# Patient Record
Sex: Male | Born: 1970 | Race: White | Hispanic: No | Marital: Married | Smoking: Never smoker
Health system: Southern US, Community
[De-identification: ages and names within clinical notes are randomized; demographics above are authoritative.]

## PROBLEM LIST (undated history)

## (undated) DIAGNOSIS — K5900 Constipation, unspecified: Secondary | ICD-10-CM

## (undated) DIAGNOSIS — F1010 Alcohol abuse, uncomplicated: Secondary | ICD-10-CM

## (undated) DIAGNOSIS — F329 Major depressive disorder, single episode, unspecified: Secondary | ICD-10-CM

## (undated) DIAGNOSIS — I10 Essential (primary) hypertension: Secondary | ICD-10-CM

## (undated) DIAGNOSIS — Z22322 Carrier or suspected carrier of Methicillin resistant Staphylococcus aureus: Secondary | ICD-10-CM

## (undated) DIAGNOSIS — F419 Anxiety disorder, unspecified: Secondary | ICD-10-CM

## (undated) DIAGNOSIS — M7989 Other specified soft tissue disorders: Secondary | ICD-10-CM

## (undated) DIAGNOSIS — R12 Heartburn: Secondary | ICD-10-CM

## (undated) DIAGNOSIS — M549 Dorsalgia, unspecified: Secondary | ICD-10-CM

## (undated) DIAGNOSIS — M2550 Pain in unspecified joint: Secondary | ICD-10-CM

## (undated) DIAGNOSIS — G4730 Sleep apnea, unspecified: Secondary | ICD-10-CM

## (undated) DIAGNOSIS — K5903 Drug induced constipation: Secondary | ICD-10-CM

## (undated) DIAGNOSIS — T402X5A Adverse effect of other opioids, initial encounter: Secondary | ICD-10-CM

## (undated) DIAGNOSIS — F32A Depression, unspecified: Secondary | ICD-10-CM

## (undated) DIAGNOSIS — G709 Myoneural disorder, unspecified: Secondary | ICD-10-CM

## (undated) HISTORY — DX: Myoneural disorder, unspecified: G70.9

## (undated) HISTORY — DX: Carrier or suspected carrier of methicillin resistant Staphylococcus aureus: Z22.322

## (undated) HISTORY — DX: Depression, unspecified: F32.A

## (undated) HISTORY — DX: Major depressive disorder, single episode, unspecified: F32.9

## (undated) HISTORY — DX: Heartburn: R12

## (undated) HISTORY — DX: Other specified soft tissue disorders: M79.89

## (undated) HISTORY — DX: Constipation, unspecified: K59.00

## (undated) HISTORY — PX: SPINAL CORD STIMULATOR INSERTION: SHX5378

## (undated) HISTORY — PX: SURGERY SCROTAL / TESTICULAR: SUR1316

## (undated) HISTORY — DX: Dorsalgia, unspecified: M54.9

## (undated) HISTORY — DX: Adverse effect of other opioids, initial encounter: T40.2X5A

## (undated) HISTORY — DX: Sleep apnea, unspecified: G47.30

## (undated) HISTORY — DX: Alcohol abuse, uncomplicated: F10.10

## (undated) HISTORY — DX: Pain in unspecified joint: M25.50

## (undated) HISTORY — DX: Anxiety disorder, unspecified: F41.9

## (undated) HISTORY — DX: Essential (primary) hypertension: I10

## (undated) HISTORY — DX: Drug induced constipation: K59.03

---

## 2005-01-06 ENCOUNTER — Ambulatory Visit: Payer: Self-pay | Admitting: Pain Medicine

## 2005-02-03 ENCOUNTER — Ambulatory Visit: Payer: Self-pay | Admitting: Pain Medicine

## 2005-02-17 ENCOUNTER — Ambulatory Visit: Payer: Self-pay | Admitting: Pain Medicine

## 2005-03-03 ENCOUNTER — Ambulatory Visit: Payer: Self-pay | Admitting: Pain Medicine

## 2005-03-18 ENCOUNTER — Ambulatory Visit: Payer: Self-pay | Admitting: Physician Assistant

## 2005-03-31 ENCOUNTER — Ambulatory Visit: Payer: Self-pay | Admitting: Physician Assistant

## 2005-04-06 ENCOUNTER — Ambulatory Visit: Payer: Self-pay | Admitting: Pain Medicine

## 2005-04-08 ENCOUNTER — Ambulatory Visit: Payer: Self-pay | Admitting: Pain Medicine

## 2005-04-14 ENCOUNTER — Ambulatory Visit: Payer: Self-pay | Admitting: Pain Medicine

## 2005-05-13 ENCOUNTER — Ambulatory Visit: Payer: Self-pay | Admitting: Pain Medicine

## 2005-06-15 ENCOUNTER — Ambulatory Visit: Payer: Self-pay | Admitting: Physician Assistant

## 2005-07-09 ENCOUNTER — Ambulatory Visit: Payer: Self-pay | Admitting: Physician Assistant

## 2005-07-27 ENCOUNTER — Ambulatory Visit: Payer: Self-pay | Admitting: Pain Medicine

## 2005-08-11 ENCOUNTER — Ambulatory Visit: Payer: Self-pay | Admitting: Physician Assistant

## 2005-09-16 ENCOUNTER — Ambulatory Visit: Payer: Self-pay | Admitting: Physician Assistant

## 2005-10-29 ENCOUNTER — Ambulatory Visit: Payer: Self-pay | Admitting: Physician Assistant

## 2005-10-29 ENCOUNTER — Other Ambulatory Visit: Payer: Self-pay

## 2005-11-05 ENCOUNTER — Ambulatory Visit: Payer: Self-pay | Admitting: Pain Medicine

## 2005-11-11 ENCOUNTER — Ambulatory Visit: Payer: Self-pay | Admitting: Pain Medicine

## 2005-12-01 ENCOUNTER — Ambulatory Visit: Payer: Self-pay | Admitting: Physician Assistant

## 2005-12-17 ENCOUNTER — Ambulatory Visit: Payer: Self-pay | Admitting: Pain Medicine

## 2005-12-20 ENCOUNTER — Ambulatory Visit: Payer: Self-pay | Admitting: Pain Medicine

## 2005-12-22 ENCOUNTER — Ambulatory Visit: Payer: Self-pay | Admitting: Pain Medicine

## 2005-12-27 ENCOUNTER — Ambulatory Visit: Payer: Self-pay | Admitting: Physician Assistant

## 2006-01-25 ENCOUNTER — Ambulatory Visit: Payer: Self-pay | Admitting: Physician Assistant

## 2006-03-02 ENCOUNTER — Ambulatory Visit: Payer: Self-pay | Admitting: Physician Assistant

## 2006-03-30 ENCOUNTER — Ambulatory Visit: Payer: Self-pay | Admitting: Pain Medicine

## 2006-04-27 ENCOUNTER — Ambulatory Visit: Payer: Self-pay | Admitting: Pain Medicine

## 2006-05-18 ENCOUNTER — Ambulatory Visit: Payer: Self-pay | Admitting: Pain Medicine

## 2006-05-25 ENCOUNTER — Ambulatory Visit: Payer: Self-pay | Admitting: Physician Assistant

## 2006-05-31 ENCOUNTER — Ambulatory Visit: Payer: Self-pay | Admitting: Pain Medicine

## 2006-06-22 ENCOUNTER — Ambulatory Visit: Payer: Self-pay | Admitting: Pain Medicine

## 2006-07-21 ENCOUNTER — Ambulatory Visit: Payer: Self-pay | Admitting: Physician Assistant

## 2006-07-26 ENCOUNTER — Ambulatory Visit: Payer: Self-pay | Admitting: Pain Medicine

## 2006-08-17 ENCOUNTER — Ambulatory Visit: Payer: Self-pay | Admitting: Pain Medicine

## 2006-09-05 ENCOUNTER — Ambulatory Visit: Payer: Self-pay | Admitting: Pain Medicine

## 2006-09-16 ENCOUNTER — Ambulatory Visit: Payer: Self-pay | Admitting: Physician Assistant

## 2006-09-20 ENCOUNTER — Ambulatory Visit: Payer: Self-pay | Admitting: Pain Medicine

## 2006-11-02 ENCOUNTER — Ambulatory Visit: Payer: Self-pay | Admitting: Physician Assistant

## 2006-12-02 ENCOUNTER — Ambulatory Visit: Payer: Self-pay | Admitting: Physician Assistant

## 2006-12-29 ENCOUNTER — Ambulatory Visit: Payer: Self-pay | Admitting: Physician Assistant

## 2007-05-08 ENCOUNTER — Ambulatory Visit: Payer: Self-pay | Admitting: Pain Medicine

## 2007-05-23 ENCOUNTER — Ambulatory Visit: Payer: Self-pay | Admitting: Pain Medicine

## 2007-06-06 ENCOUNTER — Ambulatory Visit: Payer: Self-pay | Admitting: Physician Assistant

## 2011-10-27 ENCOUNTER — Other Ambulatory Visit: Payer: Self-pay | Admitting: Pain Medicine

## 2011-10-27 ENCOUNTER — Ambulatory Visit: Payer: Self-pay | Admitting: Pain Medicine

## 2011-10-28 ENCOUNTER — Ambulatory Visit: Payer: Self-pay | Admitting: Pain Medicine

## 2011-11-26 ENCOUNTER — Ambulatory Visit: Payer: Self-pay | Admitting: Pain Medicine

## 2011-11-29 ENCOUNTER — Ambulatory Visit: Payer: Self-pay | Admitting: Pain Medicine

## 2014-07-17 ENCOUNTER — Ambulatory Visit: Payer: Self-pay | Admitting: Pain Medicine

## 2014-08-21 ENCOUNTER — Ambulatory Visit: Payer: Self-pay | Admitting: Pain Medicine

## 2014-08-21 DIAGNOSIS — I10 Essential (primary) hypertension: Secondary | ICD-10-CM | POA: Diagnosis not present

## 2014-08-21 LAB — CBC WITH DIFFERENTIAL/PLATELET
Basophil #: 0 10*3/uL (ref 0.0–0.1)
Basophil %: 0.5 %
EOS PCT: 2.5 %
Eosinophil #: 0.2 10*3/uL (ref 0.0–0.7)
HCT: 37.8 % — ABNORMAL LOW (ref 40.0–52.0)
HGB: 12.4 g/dL — ABNORMAL LOW (ref 13.0–18.0)
Lymphocyte #: 2.2 10*3/uL (ref 1.0–3.6)
Lymphocyte %: 31.4 %
MCH: 28.1 pg (ref 26.0–34.0)
MCHC: 32.9 g/dL (ref 32.0–36.0)
MCV: 86 fL (ref 80–100)
Monocyte #: 0.5 x10 3/mm (ref 0.2–1.0)
Monocyte %: 6.8 %
NEUTROS ABS: 4.2 10*3/uL (ref 1.4–6.5)
NEUTROS PCT: 58.8 %
PLATELETS: 316 10*3/uL (ref 150–440)
RBC: 4.42 10*6/uL (ref 4.40–5.90)
RDW: 14.9 % — AB (ref 11.5–14.5)
WBC: 7.2 10*3/uL (ref 3.8–10.6)

## 2014-08-21 LAB — POTASSIUM: Potassium: 4.1 mmol/L (ref 3.5–5.1)

## 2014-08-27 ENCOUNTER — Ambulatory Visit: Admit: 2014-08-27 | Disposition: A | Discharge status: Home / Self Care | Payer: Self-pay | Admitting: Pain Medicine

## 2014-08-27 ENCOUNTER — Ambulatory Visit: Payer: Self-pay | Admitting: Pain Medicine

## 2014-09-05 ENCOUNTER — Ambulatory Visit: Payer: Self-pay | Admitting: Pain Medicine

## 2015-02-08 NOTE — Op Note (Signed)
PATIENT NAME:  Ian Morse, Ian Morse MR#:  [id number] DATE OF BIRTH:  01-[name]-1971  DATE OF PROCEDURE:  08/27/2014  REFERRING PHYSICIAN: Jolayne Panther, MD  CONSULTING PAIN PHYSICIAN:  Milinda Pointer, MD    Note:  This is the case of a 44 year old morbidly obese white male patient who comes in to same-day surgery today for revision and replacement of his spinal cord stimulator generator/battery. 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  Procedure(s):  1. Neurostimulator Generator Replacement (Battery change). 2. Fluoroscopic Guidance 3. Intraoperative Analysis and Programming. 4. Postoperative Analysis and Programming. 5. Moderate Conscious Sedation by the [facility] Anesthesia Team.  Surgeon: Kathlen Brunswick. Dossie Arbour, M.D. Side of implant: Right side Diagnostic Indications:   1.  End of battery of life. 2.  Left upper extremity complex regional pain syndrome. Position: Prone.  Prepping solution: DuraPrep Area prepped: The right thoracic flank area prepped with a broad-spectrum topical antiseptic microbicide.  Infection Control:  Standard Universal Precautions taken (Respiratory Hygiene/Cough Etiquette; Mouth, nose, eye protection; Hand Hygiene; Personal protective equipment (PPE); safe injection practices; and use of masks and disposable sterile surgical gloves) as recommended by the Department of [place] for Disease Control and Prevention (CDC).  Safety Measures:  Allergies were reviewed. Appropriate site, procedure, and patient were confirmed by following the Joint Commission's Universal Protocol (UP.01.01.01). The patient was asked to confirm marked site and procedure, before commencing. The patient was asked about blood thinners, or active infections, both of which were denied. No attempt was made at seeking any  paresthesias. Aspiration looking for blood return was conducted prior to injecting. At no point did we inject any substances, as a needle was being advanced.  Pre-procedure Assessment:  A medical history and physical exam were obtained. Relevant documentation was reviewed and verified. Prior to the procedure, the patient was provided with an Audio CD, as well as written information on the procedure, including side-effects, and possible complications. Under the influence of no sedatives, a verbal, as well as a written informed consent were obtained, after having provided information on the risks and possible complications. To fulfill our ethical and legal obligations, as recommended by the American Medical Association's Code of Ethics, we have provided information to the patient about our clinical impression; the nature and purpose of an available treatment or procedure; the risks and benefits of an available treatment or procedure; alternatives; the risk and benefits of the alternative treatment or procedure; and the risks and benefits of not receiving or undergoing a treatment or procedure. The patient was provided information about the risks and possible complications associated with the procedure. These include, but not limited to, failure to achieve desired goals, infection, bleeding, organ or nerve damage, allergic reactions, paralysis, and death. In addition, the patient was informed that Medicine is not an exact science; therefore, there is also the possibility of unforeseen risks and possible complications that may result in a catastrophic outcome. The patient indicated having understood very clearly.  We have given the patient no guarantees and we have made no promises. Ample time was given to the patient to ask questions, all of which were answered, to the patient's satisfaction, before proceeding. The patient understands that by signing our informed consent form, they understand and accept the risks and  the fact that it is impossible to predict all possible complications. Baseline vital signs were taken and  the medical assessment was completed. Verification of the correct person, correct site (including marking of site), and correct procedure were performed and confirmed by the patient. Baseline vital signs were taken and the initial assessment was completed. Verification of the correct person, correct site (including marking of site), and correct procedure were performed and confirmed by the patient, in the form of a "Time Out".  Monitoring: The patient was monitored in the usual manner, using NIBPM, ECG, and pulse oximetry.  IV Access:  An IV access was obtained and secured.  Analgesia:  Moderate (Conscious) Intravenous sedation: Consent was obtained before administering any sedation. Availability of a responsible, adult driver, and NPO status confirmed. Meaningful verbal contact was maintained, with the patient at all times during the procedure. ASA Sedation Guidelines followed. For specifics on pharmacological type and quantity of sedation, please see nursing chart.  Prophylactic Antibiotics: Cefazolin (1st generation cephalosporin) 1 gm IVPB.  Local Anesthesia: Lidocaine 1%. The skin over the procedure site were infiltrated using a 3 ml Luer-Lok syringe with a 0.5 inch, 25-G needle. Deeper tissues were infiltrated using a 3.0 inch, 22-G spinal needle, under fluoroscopic guidance.  Fluoroscopy: The patient was taken to the operative suite, where the patient was placed in position for the procedure, over the fluoroscopy compatible table. Fluoroscopy was manipulated, using "Tunnel Vision Technique", to obtain the best possible view of the target area, on the affected side. Fluoroscopy time: Please see the patient's chart for details.  Description of the procedure: The procedure site was prepped using a broad-spectrum topical antiseptic. The area was then draped in the usual and standard manner.  "Time-out" was performed as per JC Universal Protocol (UP.01.01.01).   An incision was made and the old generator was located and identified. Care was taken not to damage any of the cables. The old generator was taken out and disconnected from the extension cables. At this point, the new generator was connected to the extensions and tested. An impedance check was conducted after connecting all sections of the system. After having confirmed proper working status, the new generator was placed back into the pocket. Once hemostasis was confirmed, both wounds were closed with Vicryl 2-0 after cleaning them with a solution containing 50:50 hydrogen peroxide and Betadine. Surgical staples were used to close the skin. The wounds were covered with sterile transparent bio-occlusive dressings, to easily assess any evidence of infection in the future.  The patient tolerated the entire procedure well. A repeat set of vitals were taken after the procedure and the patient was kept under observation until discharge criteria was met. The patient was provided with discharge instructions, including a section on how to identify potential problems. Should any problems arise concerning this procedure, the patient was given instructions to immediately contact us, without hesitation. The neurostimulator representative and I, both provided the patient with our Business cards containing our contact telephone numbers, and instructed the patient to contact either one of [place], at any time, should there be any problems or questions. In any case, we plan to contact the patient by telephone for a follow-up status report regarding this interventional procedure.  EBL: 10 ml  Complications: No heme; no paresthesias.  Disposition: Return to clinics in 10-11 days for removal of staples and postoperative evaluation.  Return to clinic on 09/05/2014 at 11:15 a.m.  Additional Comments/Plan: None.  Equipment used:   1.  Medtronic Restore Sure  scan sensor generator model [id number], serial number [id number].  2.  Medtronic patient programmer model [phone],  serial #[id number]. 3.  Medtronic charging system model [id number]. serial number [id number].    Disclaimer: Medicine is not an Chief Strategy Officer. The only guarantee in medicine is that nothing is guaranteed. It is important to note that the decision to proceed with this intervention was based on the information collected from the patient. The Data and conclusions were drawn from the patient's questionnaire, the interview, and the physical examination. Because the information was provided in large part by the patient, it cannot be guaranteed that it has not been purposely or unconsciously manipulated. Every effort has been made to obtain as much relevant data as possible for this evaluation. It is important to note that the conclusions that lead to this procedure are derived in large part from the available data. Always take into account that the treatment will also be dependent on availability of resources and existing treatment guidelines, considered by other Pain Management Practitioners as being common knowledge and practice, at this time. For Medico-Legal purposes, it is also important to point out that variations in procedural techniques and pharmacological choices are the acceptable norm. The indications, contraindications, technique, and results of the above procedure should only be interpreted and judged by a Board-Certified Interventional Pain Specialist with extensive familiarity and expertise in the same exact procedure and technique, doing otherwise would be inappropriate and unethical.    ____________________________ Kathlen Brunswick. Dossie Arbour, MD fan:DT D: 08/28/2014 07:04:29 ET T: 08/28/2014 10:19:32 ET JOB#: [id number]  cc: Emmarie Sannes A. Dossie Arbour, MD, <Dictator> Gaspar Cola MD ELECTRONICALLY SIGNED 08/29/2014 18:04

## 2016-09-15 DIAGNOSIS — F419 Anxiety disorder, unspecified: Secondary | ICD-10-CM | POA: Insufficient documentation

## 2016-09-15 DIAGNOSIS — N528 Other male erectile dysfunction: Secondary | ICD-10-CM | POA: Insufficient documentation

## 2016-09-15 DIAGNOSIS — I10 Essential (primary) hypertension: Secondary | ICD-10-CM | POA: Insufficient documentation

## 2017-01-17 ENCOUNTER — Telehealth: Payer: Self-pay | Admitting: *Deleted

## 2017-04-04 NOTE — Progress Notes (Signed)
Patient's Name: Ian Morse  MRN: [id number]  Referring Provider: Marinell Blight, *  DOB: 1971/08/12  PCP: Marinell Blight, PA-C  DOS: 04/05/2017  Note by: Sydnee Levans. Laban Emperor, MD  Service setting: Ambulatory outpatient  Specialty: Interventional Pain Management  Location: [facility] (AMB) Pain Management Facility    Patient type: New Patient   Primary Reason(s) for Visit: Initial Patient Evaluation CC: Neck Pain (shoulders- left is worst); Hand Pain (left pointer RSD); and Back Pain (lower)  HPI  Mr. Ian Morse is a 46 y.o. year old, male patient, who comes today for an initial evaluation. 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 His primarily concern today is the Neck Pain (shoulders- left is worst); Hand Pain (left pointer RSD); and Back Pain (lower)  Pain Assessment: Self-Reported Pain Score: 6 /10 Clinically the patient looks like a 2/10 Reported level is inconsistent with clinical observations. Information on the proper use of the pain scale provided to the patient today Pain Type: Chronic pain Pain Location: Finger (Comment which one) Pain Orientation: Left Pain Descriptors / Indicators: Aching, Nagging, Numbness, Shooting, Burning, Constant Pain Frequency: Constant  Onset and  Duration: Gradual, Started with accident and Date of onset: 10/21/2013 Cause of pain: Work related accident or event Severity: Getting worse, NAS-11 at its worse: 5/10, NAS-11 at its best: 5/10 and NAS-11 now: 5/10 Timing: Not influenced by the time of the day and During activity or exercise Aggravating Factors: Bending, Climbing, Kneeling, Lifiting, Motion, Prolonged standing, Squatting, Stooping , Twisting, Walking, Walking uphill, Walking downhill and Working Alleviating Factors: Cold packs, Hot packs, Lying down, Medications, Nerve blocks, Resting, Sitting and Sleeping Associated Problems: Color changes, Constipation, Day-time cramps, Night-time cramps, Depression, Dizziness, Erectile dysfunction, Fatigue, Inability to concentrate, Numbness, Personality changes, Sadness, Spasms, Sweating, Swelling, Temperature changes, Tingling, Weakness, Pain that wakes patient up and Pain that does not allow patient to sleep Quality of Pain: Aching, Agonizing, Annoying, Burning, Constant, Cramping, Cruel, Disabling, Distressing, Dreadful, Dull, Exhausting, Fearful, Feeling of constriction, Horrible, Hot, Itching, Nagging, Pulsating, Punishing, Sharp, Shooting, Stabbing, Superficial, Tender, Throbbing, Tingling, Tiring, Uncomfortable and Work related Previous Examinations or Tests: CT scan, MRI scan, Nerve block, X-rays, Nerve conduction test, Neurological evaluation, Orthoperdic evaluation, Chiropractic evaluation and Psychiatric evaluation Previous Treatments: Chiropractic manipulations, Hypnotherapy, Narcotic medications, Physical Therapy, Spinal cord stimulator and Steroid treatments by mouth  The patient comes into the clinics today for the first time for a chronic pain management evaluation. According to the patient and primary area of pain is that of the left upper extremity, where he developed a complex regional pain syndrome. This patient used to be a patient of mine and he moved to another practice and has  now come back to [place] once again. Years ago I implanted a cervical spinal cord stimulator for his left upper extremity complex regional pain syndrome which continues to work at this time. The original injury was a crushed injury to the index finger triggering this  CRPS. The patient denies any prior surgeries in the left upper extremity but does admit to some nerve blocks done by myself, a Bier blocks done by Dr. Yolanda Bonine and in [place], and some stellate ganglion blocks done by Dr. Lenore Cordia in [place]. He indicates that the Bier blocks never helped and the relief from the stellate ganglion blocks were very limited. He also admits to having had physical therapy at [facility] around 2005. He indicates that this consisted of 2 visits per week for proximally 6 weeks. He denies any recent x-rays or MRIs but he indicates having had some nerve conduction tests, which he thinks he had in [facility] or the [facility] in [place]. The next area of pain is described to be the posterior aspect of the neck with the left side being worse than the right. He refers having pain between the shoulder blades. He denies any prior neck surgery, nerve blocks, (except for the stellate ganglion blocks), joint injections, physical therapy, or recent x-rays for the neck area. The third area of pain is described to be the testicles were he indicates having bilateral pain with the left being worst on the right. He indicates that this was secondary to an injury he received with a tractor, many years ago. He indicates having had some surgery but denies any nerve blocks, joint injections, physical therapy, or any recent x-rays or MRIs. The next area of pain is described to be that of the lower back with the pain is located primarily in the center of the back but spreading to both sides with the left being worst on the right. He indicates that the pain on the left side refers to the buttocks area and through  the back of the left lower extremity to the level of the knee. He indicates having had some nerve blocks done by me, several years ago, but he denies any back surgery, joint injections, physical therapy, or recent x-rays. The patient continues to use his Cervical spinal cord stimulator which has a battery implanted on his right flank. He describes that we changed the battery proximally 2-3 years ago.  According to the patient, he describes currently controlling his pain with the spinal cord stimulator as well as high-dose opioids. 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 He describes that at that point he decided not to go back to the methadone. Over the years, unfortunately his opioid consumption has increased to the point where he was using OxyContin 40 mg by mouth twice a day + OxyContin IR 15 mg by mouth 3 times a day. Due to insurance reasons, he was recently switched to MS Contin 30 mg by mouth twice a day + oxycodone 15 mg by mouth 3 times a day. He describes that this does not work as well as the OxyContin. One reason for this may be the fact that while he was on the OxyContin he had a 187.5 MME/Day, while now he is on 127.5 MME/Day.  Note: Review of the actual PMP shows that the patient is actually taking morphine ER 30 mg 1 tablet by mouth twice a day (60 mg/day of morphine) + oxycodone IR 15 mg 1 tablet by mouth every 6 hours when necessary for breakthrough pain (60 mg/day of oxycodone). This represents a 150 MME/Day.  Today I took the time to provide the patient with information regarding my pain practice. The patient was informed that my practice is divided  into two sections: an interventional pain management section, as well as a completely separate and distinct medication management section. I explained that I have procedure days for my interventional therapies, and evaluation days for follow-ups and medication management. Because of the  amount of documentation required during both, they are kept separated. This means that there is the possibility that he may be scheduled for a procedure on one day, and medication management the next. I have also informed him that because of staffing and facility limitations, I no longer take patients for medication management only. To illustrate the reasons for this, I gave the patient the example of surgeons, and how inappropriate it would be to refer a patient to his/her care, just to write for the post-surgical antibiotics on a surgery done by a different surgeon.   Because interventional pain management is my board-certified specialty, the patient was informed that joining my practice means that they are open to any and all interventional therapies. I made it clear that this does not mean that they will be forced to have any procedures done. What this means is that I believe interventional therapies to be essential part of the diagnosis and proper management of chronic pain conditions. Therefore, patients not interested in these interventional alternatives will be better served under the care of a different practitioner.  The patient was also made aware of my Comprehensive Pain Management Safety Guidelines where by joining my practice, they limit all of their nerve blocks and joint injections to those done by our practice, for as long as we are retained to manage their care.   Historic Controlled Substance Pharmacotherapy Review  PMP and historical list of controlled substances: Morphine ER 30 mg; oxycodone IR 15 mg; OxyContin 40 mg; alprazolam 1 mg; Hydromet syrup; Fioricet with codeine; Opana ER 20 mg; Avinza 60 mg; morphine IR 15 mg Highest opioid analgesic regimen found: OxyContin 40 mg 1 tablet by mouth twice a day (80 mg/day of oxycodone) + oxycodone IR 15 mg 1 tablet by mouth every 6 hours (60 mg/day of oxycodone) (Total daily oxycodone: 140 mg/day) (210 MME/Day) last prescribed on  12/14/2016) Most recent opioid analgesic:  morphine ER 30 mg 1 tablet by mouth twice a day (60 mg/day of morphine) + oxycodone IR 15 mg 1 tablet by mouth every 6 hours (60 mg/day of oxycodone) (150 MME/Day) (last written on 03/08/2017) Current opioid analgesics: morphine ER 30 mg 1 tablet by mouth twice a day (60 mg/day of morphine) + oxycodone IR 15 mg 1 tablet by mouth every 6 hours (60 mg/day of oxycodone) (150 MME/Day) Highest recorded MME/day:  210 mg/day MME/day: 150 mg/day Medications: The patient did not bring the medication(s) to the appointment, as requested in our "New Patient Package" Pharmacodynamics: Desired effects: Analgesia: The patient reports <50% benefit. Reported improvement in function: The patient reports medication allows him to accomplish basic ADLs. Clinically meaningful improvement in function (CMIF): Sustained CMIF goals met Perceived effectiveness: Described as relatively effective, allowing for increase in activities of daily living (ADL) Undesirable effects: Side-effects or Adverse reactions: None reported Historical Monitoring: The patient  reports that he does not use drugs. List of all UDS Test(s): No results found for: MDMA, COCAINSCRNUR, PCPSCRNUR, PCPQUANT, CANNABQUANT, THCU, ETH List of all Serum Drug Screening Test(s):  No results found for: AMPHSCRSER, BARBSCRSER, BENZOSCRSER, COCAINSCRSER, PCPSCRSER, PCPQUANT, THCSCRSER, CANNABQUANT, OPIATESCRSER, OXYSCRSER, PROPOXSCRSER Historical Background Evaluation: [place] PDMP: Six (6) year initial data search conducted. Regular, uninterrupted pattern of monthly opioid refills detected. The patient  seems to be using opioids on a regular basis since 04/11/2011. The beginning some the PMP seem to have been around that time and clearly the patient has been on opioids for longer than that since Avinza 60 mg daily + morphine IR 15 mg by mouth 3 times a day (105 MME/Day). Furthermore, contrary to the patient's description of  the events, at no point on the PMP since 04/11/2011 has this patient received any prescriptions from me. In addition, there is no documented prior history of him having used methadone meaning that this predates the beginning some the PMP system. In addition, there is absolutely no evidence that this patient has taking a "Drug Holiday" since 04/11/2011. [place] Department of public safety, offender search: Engineer, mining Information) Non-contributory Risk Assessment Profile: Aberrant behavior: continued use despite claims of ineffective analgesia, inability to consider abstinence, claims that "nothing else works" and request for speciifc drugs or requesting "Brand Name" durgs Risk factors for fatal opioid overdose: Concomitant use of Benzodiazepines, Male gender, Age 54-55 years old, Caucasian and High daily dosage Fatal overdose hazard ratio (HR): 2.04 for doses equal to, or higher than 100 MME/day Non-fatal overdose hazard ratio (HR): 2.88 for doses equal to, or higher than 200 MME/day Risk of opioid abuse or dependence: 0.7-3.0% with doses ? 36 MME/day and 6.1-26% with doses ? 120 MME/day. Substance use disorder (SUD) risk level: Moderate Opioid risk tool (ORT) (Total Score): 6  ORT Scoring interpretation table:  Score <3 = Low Risk for SUD  Score between 4-7 = Moderate Risk for SUD  Score >8 = High Risk for Opioid Abuse   PHQ-2 Depression Scale:  Total score: 0  PHQ-2 Scoring interpretation table: (Score and probability of major depressive disorder)  Score 0 = No depression  Score 1 = 15.4% Probability  Score 2 = 21.1% Probability  Score 3 = 38.4% Probability  Score 4 = 45.5% Probability  Score 5 = 56.4% Probability  Score 6 = 78.6% Probability   PHQ-9 Depression Scale:  Total score: 0  PHQ-9 Scoring interpretation table:  Score 0-4 = No depression  Score 5-9 = Mild depression  Score 10-14 = Moderate depression  Score 15-19 = Moderately severe depression  Score 20-27 = Severe depression (2.4  times higher risk of SUD and 2.89 times higher risk of overuse)   Pharmacologic Plan: Pending ordered tests and/or consults  Meds  The patient has a current medication list which includes the following prescription(s): albuterol, butalbital-acetaminophen-caffeine, calcium-vitamin d, duloxetine, fluoxetine, fluticasone, gabapentin, ginkgo biloba, klor-con m10, krill oil, lactulose, lisinopril-hydrochlorothiazide, morphine, multiple vitamins-minerals, oxycodone, magnesium (amino acid chelate), testosterone, trazodone, turmeric, and vitamin b-12.  No current outpatient prescriptions on file prior to visit.   No current facility-administered medications on file prior to visit.    Imaging Review  Cervical Imaging: Cervical MR wo contrast:  Results for orders placed in visit on 04/06/05  MR C Spine Ltd W/O Cm   Narrative * PRIOR REPORT IMPORTED FROM AN EXTERNAL SYSTEM *   PRIOR REPORT IMPORTED FROM THE [facility] WORKFLOW SYSTEM   REASON FOR EXAM:  Chronic neck and shoulder pain as well as upper  extremities  COMMENTS:   PROCEDURE:     MR  - MR CERVICAL SPINE WO CONT  - Apr 06 2005 10:27AM   RESULT:     Multisequence/multiplanar imaging of the cervical spine was  performed without contrast.  No evidence of abnormal signal is noted  within the brachium pontis, brainstem, or proximal spinal cord.  The  cervical  spine aligns anatomically without evidence of abnormal osseous signal.  There is disk desiccation and mild intervertebral disk space narrowing throughout  the cervical spine.  The prevertebral soft tissues and posterior elements are  unremarkable.  The axial images show no evidence of neural foraminal  narrowing or central canal stenosis at C2-3 or C3-4.  At C4-5 there is a  small posterior spur which is impinging upon the anterior aspect of the  thecal sac and causing some flattening but there is no neural foraminal  narrowing or central canal stenosis.  At C5-6 and C6-7 there are  prominent  posterior uncovertebral spurs along with central subligamentous disk  bulging.  These are combining to flatten the anterior aspect of the thecal  sac.  I do not see obvious neural foraminal narrowing at these levels but  there is borderline central canal stenosis at C5-6 due to these arthritic  changes with the AP diameter of the central canal measuring only 1 cm.  There is a normal relationship between C7 and T1.   IMPRESSION:   There are posterior uncovertebral spurs at C5-6 and C6-7.  These in  combination with central subligamentous disk bulging are flattening the  anterior aspect of the thecal sac at both of these levels with borderline  central canal stenosis noted at C5-6 as the AP diameter measures only 1  cm.  No obvious neural foraminal narrowing is identified.   No definite HNP is noted.   No evidence of abnormal signal is noted within the cervical cord.   The prevertebral soft tissues and posterior elements are unremarkable.       Thoracic Imaging: Thoracic MR wo contrast:  Results for orders placed in visit on 04/08/05  MR T Spine Ltd W/O Cm   Narrative * PRIOR REPORT IMPORTED FROM AN EXTERNAL SYSTEM *   PRIOR REPORT IMPORTED FROM THE [facility] WORKFLOW SYSTEM   REASON FOR EXAM:  Neck and Shoulder Pain Chronic Upper Extr  COMMENTS:  Cell [phone] or Home [phone]   PROCEDURE:     MR  - MR THORACIC SPINE WO  - Apr 08 2005 10:26AM   RESULT:     Multiplanar and multisequence images of the thoracic spine are  obtained.  There is no evidence of disc protrusion or spinal stenosis.   The  neural foramen are patent.  No bony lesions are identified.  No paraspinal  lesions are identified.   IMPRESSION:     No significant abnormalities are identified.       Thoracic CT w/wo contrast:  Results for orders placed in visit on 07/27/05  CT T Spine Ltd Wo Or W/ Cm   Narrative * PRIOR REPORT IMPORTED FROM AN EXTERNAL SYSTEM *   PRIOR REPORT IMPORTED FROM  THE [facility] WORKFLOW SYSTEM   REASON FOR EXAM:  PTSB  COMMENTS:   PROCEDURE:     CT  - CT THORACIC SPINE WO  - Jul 27 2005  8:57AM   RESULT:     A needle was placed in the RIGHT paraspinal region at  approximately T3.  Contrast was injected adjacent to the vertebral body on  the RIGHT.  The procedure was performed by Dr. Laban Emperor.   IMPRESSION:   1)See above.   Thank you for this opportunity to contribute to the care of your patient.       Lumbosacral Imaging: Lumbar CT wo contrast:  Results for orders placed in visit on 05/18/06  CT Lumbar Spine Wo Contrast  Narrative * PRIOR REPORT IMPORTED FROM AN EXTERNAL SYSTEM *   PRIOR REPORT IMPORTED FROM THE [facility] WORKFLOW SYSTEM   REASON FOR EXAM:  left lumbar radiculitis  COMMENTS:   PROCEDURE:     CT  - CT LUMBAR SPINE WO  - May 18 2006 12:45PM   RESULT:   HISTORY: LEFT lumbar radiculitis.   COMPARISON STUDIES: No recent.   PROCEDURE AND FINDINGS: Coronal and sagittal as well as axial images of the lumbar spine were obtained following lumbar myelography and reveal no  evidence of disc protrusion or spinal stenosis. The neural foramina are  patent. The lumbar cord and bony structures are normal.   IMPRESSION:   1)Negative exam.   Thank you for this opportunity to contribute to the care of your patient.       Lumbar DG Myelogram views:  Results for orders placed in visit on 05/18/06  DG Myelogram Lumbar   Narrative * PRIOR REPORT IMPORTED FROM AN EXTERNAL SYSTEM *   PRIOR REPORT IMPORTED FROM THE [facility] WORKFLOW SYSTEM   REASON FOR EXAM:   Left lumbar radiculitis  COMMENTS:   PROCEDURE:     FL  - FL MYELOGRAM LUMBAR SPINE  - May 18 2006  9:45AM   RESULT:   HISTORY:  Back pain.   PROCEDURE AND FINDINGS:   Standard lumbar spine myelography is obtained and reveals contrast in the thecal sac with no evidence of spinal stenosis. There is no evidence of nerve root entrapment.  The patient has a  neurostimulator.   The pedicles are intact.   IMPRESSION:   No evidence of spinal stenosis or significant disc protrusion.  Reference  is made, however, to post myelography CT report.   Thank you for this opportunity to contribute to the care of your patient.       Note: Available results from prior imaging studies were reviewed.        ROS  Cardiovascular History: Hypertension Pulmonary or Respiratory History: Shortness of breath and Sleep apnea Neurological History: Negative for epilepsy, stroke, urinary or fecal inontinence, spina bifida or tethered cord syndrome Review of Past Neurological Studies: No results found for this or any previous visit. Psychological-Psychiatric History: Anxiety, Depression, Panic Attacks and Insomnia Gastrointestinal History: Constipation Genitourinary History: Negative for nephrolithiasis, hematuria, renal failure or chronic kidney disease Hematological History: Negative for anticoagulant therapy, anemia, bruising or bleeding easily, hemophilia, sickle cell disease or trait, thrombocytopenia or coagulupathies Endocrine History: Negative for diabetes or thyroid disease Rheumatologic History: Negative for lupus, osteoarthritis, rheumatoid arthritis, myositis, polymyositis or fibromyagia Musculoskeletal History: Negative for myasthenia gravis, muscular dystrophy, multiple sclerosis or malignant hyperthermia Work History: Disabled  Allergies  Mr. Gravois is allergic to lyrica [pregabalin].  Laboratory Chemistry  Inflammation Markers Lab Results  Component Value Date   CRP 5.7 (H) 04/05/2017   ESRSEDRATE 10 04/05/2017   (CRP: Acute Phase) (ESR: Chronic Phase) Renal Function Markers Lab Results  Component Value Date   BUN 15 04/05/2017   CREATININE 0.68 (L) 04/05/2017   GFRAA 131 04/05/2017   GFRNONAA 114 04/05/2017   Hepatic Function Markers Lab Results  Component Value Date   AST 16 04/05/2017   ALT 24 04/05/2017   ALBUMIN 4.5 04/05/2017   ALKPHOS 82  04/05/2017   Electrolytes Lab Results  Component Value Date   NA 144 04/05/2017   K 4.1 04/05/2017   CL 102 04/05/2017   CALCIUM 9.5 04/05/2017   MG 2.1 04/05/2017   Neuropathy Markers Lab Results  Component Value Date   VITAMINB12 438 04/05/2017   Bone Pathology Markers Lab Results  Component Value Date   ALKPHOS 82 04/05/2017   25OHVITD1 WILL FOLLOW 04/05/2017   25OHVITD2 WILL FOLLOW 04/05/2017   25OHVITD3 WILL FOLLOW 04/05/2017   CALCIUM 9.5 04/05/2017   Coagulation Parameters Lab Results  Component Value Date   PLT 316 08/21/2014   Cardiovascular Markers Lab Results  Component Value Date   HGB 12.4 (L) 08/21/2014   HCT 37.8 (L) 08/21/2014   Note: Lab results reviewed.  PFSH  Drug: Mr. Furnish  reports that he does not use drugs. Alcohol:  reports that he does not drink alcohol. Tobacco:  reports that he has never smoked. His smokeless tobacco use includes Chew. Medical:  has a past medical history of Anxiety; Depression; Hypertension; MRSA carrier; and Neuromuscular disorder (HCC). Family: family history is not on file.  Past Surgical History:  Procedure Laterality Date  . SPINAL CORD STIMULATOR INSERTION    . SURGERY SCROTAL / TESTICULAR     fell off lawn motor   Active Ambulatory Problems    Diagnosis Date Noted  . Anemia 04/05/2017  . Anxiety 09/15/2016  . Benign essential hypertension 09/15/2016  . Chronic pain syndrome 04/05/2017  . Complex regional pain syndrome type I of left upper extremity 04/05/2017  . Constipation 04/05/2017  . Insomnia 04/05/2017  . Localized edema 04/05/2017  . Major depressive disorder, single episode 04/05/2017  . Other long term (current) drug therapy 04/05/2017  . Other male erectile dysfunction 09/15/2016  . Rotator cuff tendinitis, right 02/01/2017  . Testicular hypofunction 04/05/2017  . Long term (current) use of opiate analgesic 04/05/2017  . Long term prescription opiate use 04/05/2017  . Opiate use  04/05/2017  . Chronic pain of left upper extremity 04/05/2017  . Chronic neck pain 04/05/2017  . 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 Chronic bilateral low back pain without sciatica 04/05/2017  . Spinal cord stimulator status 04/05/2017  . Encounter for interrogation of neurostimulator 04/05/2017  . Complex regional pain syndrome of left upper extremity 04/05/2017   Resolved Ambulatory Problems    Diagnosis Date Noted  . No Resolved Ambulatory Problems   Past Medical History:  Diagnosis Date  . Anxiety   . Depression   . Hypertension   . MRSA carrier   . Neuromuscular disorder (HCC)    Constitutional Exam  General appearance: Well nourished, well developed, and well hydrated. In no apparent acute distress Vitals:   04/05/17 1139  BP: 133/73  Pulse: 63  Resp: 16  Temp: 98 F (36.7 C)  SpO2: 98%  Weight: 294 lb 4.8 oz (133.5 kg)  Height: 5\' 9"  (1.753 m)   BMI Assessment: Estimated body mass index is 43.46 kg/m as calculated from the following:   Height as of this encounter: 5\' 9"  (1.753 m).   Weight as of this encounter: 294 lb 4.8 oz (133.5 kg).  BMI interpretation table: BMI level Category Range association with higher incidence of chronic pain  <18 kg/m2 Underweight   18.5-24.9 kg/m2 Ideal body weight   25-29.9 kg/m2 Overweight Increased incidence by 20%  30-34.9 kg/m2 Obese (Class I) Increased incidence by 68%  35-39.9 kg/m2 Severe obesity (Class II) Increased incidence by 136%  >40 kg/m2 Extreme obesity (Class III) Increased incidence by 254%   BMI Readings from Last 4 Encounters:  04/05/17 43.46 kg/m   Wt Readings from Last 4 Encounters:  04/05/17 294 lb 4.8 oz (133.5 kg)  Psych/Mental status: Alert,  oriented x 3 (person, place, & time)       Eyes: PERLA Respiratory: No evidence of acute respiratory distress  Cervical Spine Exam  Inspection: No masses, redness, or swelling Alignment: Symmetrical Functional ROM: Decreased ROM       Stability: No instability detected Muscle strength & Tone: Functionally intact Sensory: Movement-associated discomfort Palpation: No palpable anomalies              Upper Extremity (UE) Exam    Side: Right upper extremity  Side: Left upper extremity  Inspection: No masses, redness, swelling, or asymmetry. No contractures  Inspection: No masses, redness, swelling, or asymmetry. No contractures  Functional ROM: Unrestricted ROM          Functional ROM: Decreased ROM          Muscle strength & Tone: Functionally intact  Muscle strength & Tone: Deconditioned  Sensory: Unimpaired  Sensory: Neurogenic pain pattern  Palpation: No palpable anomalies              Palpation: No palpable anomalies              Specialized Test(s): Deferred         Specialized Test(s): Deferred          Thoracic Spine Exam  Inspection: No masses, redness, or swelling Alignment: Symmetrical Functional ROM: Unrestricted ROM Stability: No instability detected Sensory: Unimpaired Muscle strength & Tone: No palpable anomalies  Lumbar Spine Exam  Inspection: No masses, redness, or swelling Alignment: Symmetrical Functional ROM: Diminished ROM      Stability: No instability detected Muscle strength & Tone: Functionally intact Sensory: Movement-associated discomfort Palpation: Complains of area being tender to palpation       Provocative Tests: Lumbar Hyperextension and rotation test: evaluation deferred today       Patrick's Maneuver: evaluation deferred today                    Gait & Posture Assessment  Ambulation: Unassisted Gait: Relatively normal for age and body habitus Posture: WNL   Lower Extremity Exam    Side: Right lower extremity  Side: Left lower extremity  Inspection: No masses, redness, swelling, or asymmetry. No contractures  Inspection: No masses, redness, swelling, or asymmetry. No contractures  Functional ROM: Unrestricted ROM          Functional ROM: Unrestricted ROM          Muscle  strength & Tone: Functionally intact  Muscle strength & Tone: Functionally intact  Sensory: Unimpaired  Sensory: Unimpaired  Palpation: No palpable anomalies  Palpation: No palpable anomalies   Assessment  Primary Diagnosis & Pertinent Problem List: The primary encounter diagnosis was Chronic pain syndrome. 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  Visit Diagnosis: 1. Chronic pain syndrome   2. Complex regional pain syndrome type 1 of left upper extremity   3. Chronic pain of left upper extremity   4. Chronic neck pain   5. 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 Chronic bilateral low back pain without sciatica   7. Spinal cord stimulator status   8. Rotator cuff tendinitis, right   9. Long term (current) use of  opiate analgesic   10. Long term prescription opiate use   11. Opiate use    Plan of Care  Initial treatment plan:  Please be advised that as per protocol, today's visit has been an evaluation only. We have not taken over the patient's controlled substance management.  Problem-specific plan: No problem-specific Assessment & Plan notes found for this encounter.  Ordered Lab-work, Procedure(s), Referral(s), & Consult(s): Orders Placed This Encounter  Procedures  . DG Cervical Spine Complete  . DG Lumbar Spine Complete W/Bend  . DG Shoulder Right  . Compliance Drug Analysis, Ur  . Comprehensive metabolic panel  . C-reactive protein  . Sedimentation rate  . Magnesium  . 25-Hydroxyvitamin D Lcms D2+D3  . Vitamin B12   Pharmacotherapy: Medications ordered:  No orders of the defined types were  placed in this encounter.  Medications administered during this visit: Mr. Hundley had no medications administered during this visit.   Pharmacotherapy under consideration:  Opioid Analgesics: The patient was informed that there is no guarantee that he would be a candidate for opioid analgesics. The decision will be made following CDC guidelines. This decision will be based on the results of diagnostic studies, as well as Mr. Point risk profile. Today I informed Mr. Walczak that we will not be using opioids at the dose that he currently has. Today I took time to explain to him about "Drug Holidays". He is now aware that should he join our practice, the first thing that we will do is to taper down his opioids until we can completely stop them. Once this is achieved, we will reevaluate whether or not we want to put him back on any. Membrane stabilizer: To be determined at a later time Muscle relaxant: To be determined at a later time NSAID: To be determined at a later time Other analgesic(s): To be determined at a later time   Interventional therapies under consideration: Mr. Spillman was informed that there is no guarantee that he would be a candidate for interventional therapies. The decision will be based on the results of diagnostic studies, as well as Mr. Deguzman risk profile.  Possible procedure(s): Diagnostic left cervical epidural steroid injection  Diagnostic bilateral cervical facet block  Possible bilateral cervical facet RFA  Diagnostic bilateral lumbar facet block  Possible bilateral lumbar facet RFA    Provider-requested follow-up: Return in about 2 weeks (around 04/19/2017) for 2nd Visit.  Future Appointments Date Time Provider Department Center  04/26/2017 10:45 AM Delano Metz, MD [facility] None    Primary Care Physician: Marinell Blight, PA-C Location: [facility] Note by: Sydnee Levans Laban Emperor, M.D, DABA, DABAPM, DABPM, DABIPP,  FIPP Date: 04/05/2017; Time: 1:26 PM  Patient instructions provided during this appointment: Patient Instructions    ____________________________________________________________________________________________  Appointment Policy Summary  It is our goal and responsibility to provide the medical community with assistance in the evaluation and management of patients with chronic pain. Unfortunately our resources are limited. Because we do not have an unlimited amount of time, or available appointments, we are required to closely monitor and manage their use. The following rules exist to maximize their use:  Patient's responsibilities: 1. Punctuality: You are required to be physically present and registered in our facility at least 30 minutes before your appointment. 2. Tardiness: The cutoff is your appointment time. If you have an appointment scheduled for 10:00 AM and you arrive at 10:01, you will be required to reschedule your appointment.  3. Plan ahead: Always assume  that you will encounter traffic on your way in. Plan for it. If you are dependent on a driver, make sure they understand these rules and the need to arrive early. 4. Other appointments and responsibilities: Avoid scheduling any other appointments before or after your pain clinic appointments.  5. Be prepared: Write down everything that you need to discuss with your healthcare provider and give this information to the admitting nurse. Write down the medications that you will need refilled. Bring your pills and bottles (even the empty ones), to all of your appointments, except for those where a procedure is scheduled. 6. No children or pets: Find someone to take care of them. It is not appropriate to bring them in. 7. Scheduling changes: We request "advanced notification" of any changes or cancellations. 8. Advanced notification: Defined as a time period of more than 24 hours prior to the originally scheduled appointment. This allows  for the appointment to be offered to other patients. 9. Rescheduling: When a visit is rescheduled, it will require the cancellation of the original appointment. For this reason they both fall within the category of "Cancellations".  10. Cancellations: They require advanced notification. Any cancellation less than 24 hours before the  appointment will be recorded as a "No Show". 11. No Show: Defined as an unkept appointment where the patient failed to notify or declare to the practice their intention or inability to keep the appointment.  Corrective process for repeat offenders:  1. Tardiness: Three (3) episodes of rescheduling due to late arrivals will be recorded as one (1) "No Show". 2. Cancellation or reschedule: Three (3) cancellations or rescheduling will be recorded as one (1) "No Show". 3. "No Shows": Three (3) "No Shows" within a 12 month period will result in discharge from the practice.  ____________________________________________________________________________________________  ____________________________________________________________________________________________  Pain Scale  Introduction: The pain score used by this practice is the Verbal Numerical Rating Scale (VNRS-11). This is an 11-point scale. It is for adults and children 10 years or older. There are significant differences in how the pain score is reported, used, and applied. Forget everything you learned in the past and learn this scoring system.  General Information: The scale should reflect your current level of pain. Unless you are specifically asked for the level of your worst pain, or your average pain. If you are asked for one of these two, then it should be understood that it is over the past 24 hours.  Basic Activities of Daily Living (ADL): Personal hygiene, dressing, eating, transferring, and using restroom.  Instructions: Most patients tend to report their level of pain as a combination of two factors,  their physical pain and their psychosocial pain. This last one is also known as "suffering" and it is reflection of how physical pain affects you socially and psychologically. From now on, report them separately. From this point on, when asked to report your pain level, report only your physical pain. Use the following table for reference.  Pain Clinic Pain Levels (0-5/10)  Pain Level Score  Description  No Pain 0   Mild pain 1 Nagging, annoying, but does not interfere with basic activities of daily living (ADL). Patients are able to eat, bathe, get dressed, toileting (being able to get on and off the toilet and perform personal hygiene functions), transfer (move in and out of bed or a chair without assistance), and maintain continence (able to control bladder and bowel functions). Blood pressure and heart rate are unaffected. A normal heart rate for  a healthy adult ranges from 60 to 100 bpm (beats per minute).   Mild to moderate pain 2 Noticeable and distracting. Impossible to hide from other people. More frequent flare-ups. Still possible to adapt and function close to normal. It can be very annoying and may have occasional stronger flare-ups. With discipline, patients may get used to it and adapt.   Moderate pain 3 Interferes significantly with activities of daily living (ADL). It becomes difficult to feed, bathe, get dressed, get on and off the toilet or to perform personal hygiene functions. Difficult to get in and out of bed or a chair without assistance. Very distracting. With effort, it can be ignored when deeply involved in activities.   Moderately severe pain 4 Impossible to ignore for more than a few minutes. With effort, patients may still be able to manage work or participate in some social activities. Very difficult to concentrate. Signs of autonomic nervous system discharge are evident: dilated pupils (mydriasis); mild sweating (diaphoresis); sleep interference. Heart rate becomes elevated  (>115 bpm). Diastolic blood pressure (lower number) rises above 100 mmHg. Patients find relief in laying down and not moving.   Severe pain 5 Intense and extremely unpleasant. Associated with frowning face and frequent crying. Pain overwhelms the senses.  Ability to do any activity or maintain social relationships becomes significantly limited. Conversation becomes difficult. Pacing back and forth is common, as getting into a comfortable position is nearly impossible. Pain wakes you up from deep sleep. Physical signs will be obvious: pupillary dilation; increased sweating; goosebumps; brisk reflexes; cold, clammy hands and feet; nausea, vomiting or dry heaves; loss of appetite; significant sleep disturbance with inability to fall asleep or to remain asleep. When persistent, significant weight loss is observed due to the complete loss of appetite and sleep deprivation.  Blood pressure and heart rate becomes significantly elevated. Caution: If elevated blood pressure triggers a pounding headache associated with blurred vision, then the patient should immediately seek attention at an urgent or emergency care unit, as these may be signs of an impending stroke.    Emergency Department Pain Levels (6-10/10)  Emergency Room Pain 6 Severely limiting. Requires emergency care and should not be seen or managed at an outpatient pain management facility. Communication becomes difficult and requires great effort. Assistance to reach the emergency department may be required. Facial flushing and profuse sweating along with potentially dangerous increases in heart rate and blood pressure will be evident.   Distressing pain 7 Self-care is very difficult. Assistance is required to transport, or use restroom. Assistance to reach the emergency department will be required. Tasks requiring coordination, such as bathing and getting dressed become very difficult.   Disabling pain 8 Self-care is no longer possible. At this level,  pain is disabling. The individual is unable to do even the most "basic" activities such as walking, eating, bathing, dressing, transferring to a bed, or toileting. Fine motor skills are lost. It is difficult to think clearly.   Incapacitating pain 9 Pain becomes incapacitating. Thought processing is no longer possible. Difficult to remember your own name. Control of movement and coordination are lost.   The worst pain imaginable 10 At this level, most patients pass out from pain. When this level is reached, collapse of the autonomic nervous system occurs, leading to a sudden drop in blood pressure and heart rate. This in turn results in a temporary and dramatic drop in blood flow to the brain, leading to a loss of consciousness. Fainting is one of  the body's self defense mechanisms. Passing out puts the brain in a calmed state and causes it to shut down for a while, in order to begin the healing process.    Summary: 1. Refer to this scale when providing [place] with your pain level. 2. Be accurate and careful when reporting your pain level. This will help with your care. 3. Over-reporting your pain level will lead to loss of credibility. 4. Even a level of 1/10 means that there is pain and will be treated at our facility. 5. High, inaccurate reporting will be documented as "Symptom Exaggeration", leading to loss of credibility and suspicions of possible secondary gains such as obtaining more narcotics, or wanting to appear disabled, for fraudulent reasons. 6. Only pain levels of 5 or below will be seen at our facility. 7. Pain levels of 6 and above will be sent to the Emergency Department and the appointment cancelled. ____________________________________________________________________________________________  ____________________________________________________________________________________________  DRUG HOLIDAYS  Definitions Tolerance: defined as the progressively decreased responsiveness to a  drug. Occurs when the drug is used repeatedly and the body adapts to the continued presence of the drug. As a result, a larger dose of the drug is needed to achieve the effect originally obtained by a smaller dose. It is thought to be due to the formation of excess opioid receptors.  Drug Holiday: is when a patient stops taking a medication(s) for a period of time; anywhere from a few days to several weeks.  Withdrawals: refers to the wide range of symptoms that occur after stopping or dramatically reducing opiate drugs after heavy and prolonged use. Withdrawal symptoms do not occur to patients that use low dose opioids, or those who take the medication sporadically. Contrary to benzodiazepine (example: Valium, Xanax, etc.) or alcohol withdrawals ("Delirium Tremens"), opioid withdrawals are not lethal. Withdrawals are the physical manifestation of the body getting rid of the excess receptors.  Purpose To eliminate tolerance.  Duration of Holiday 14 consecutive days. (2 weeks)  Expected Symptoms Early symptoms of withdrawal include: . Agitation . Anxiety . Muscle aches . Increased tearing . Insomnia . Runny nose . Sweating . Yawning  Late symptoms of withdrawal include: . Abdominal cramping . Diarrhea . Dilated pupils . Goose bumps . Nausea . Vomiting  Opioid withdrawal reactions are very uncomfortable but are not life-threatening. Symptoms usually start within 12 hours of last opioid dose and within 30 hours of last methadone exposure.  Duration of Symptoms 48 to 72 hours for short acting medications and 2 to 14 days for methadone.  Treatment . Clonidine (CatapresT) or tizanidine (ZanaflexT) for agitation, sweating, tearing, runny nose. . Promethazine (PhenerganT) for nausea, vomiting. Marland Kitchen NSAIDs for pain.  Benefits . Improved effectiveness of opioids. . Decreased opioid dose needed to achieve benefits. . Improved pain with lesser  dose. ____________________________________________________________________________________________   ____________________________________________________________________________________________  Medication Rules  Applies to: All patients receiving prescriptions (written or electronic).  Pharmacy of record: Pharmacy where electronic prescriptions will be sent. If written prescriptions are taken to a different pharmacy, please inform the nursing staff. The pharmacy listed in the electronic medical record should be the one where you would like electronic prescriptions to be sent.  Prescription refills: Only during scheduled appointments. Applies to both, written and electronic prescriptions.  NOTE: The following applies primarily to controlled substances (Opioid Pain Medications)  Patient's responsibilities: 1. Pain Pills: Bring all pain pills to every appointment (except for procedure appointments). 2. Pill Bottles: Bring pills in original pharmacy bottle. Always bring newest bottle.  Bring bottle, even if empty. 3. Medication refills: You are responsible for knowing and keeping track of what medications you need refilled. The day before your appointment, write a list of all prescriptions that need to be refilled. Bring that list to your appointment and give it to the admitting nurse. Prescriptions will be written only during appointments. If you forget a medication, it will not be "Called in", "Faxed", or "electronically sent". You will need to get another appointment to get these prescribed. 4. Prescription Accuracy: You are responsible for carefully inspecting your prescriptions before leaving our office. Have the discharge nurse carefully go over each prescription with you, before taking them home. Make sure that your name is accurately spelled, that your address is correct. Check the name and dose of your medication to make sure it is accurate. Check the number of pills, and the written  instructions to make sure they are clear and accurate. Make sure that you are given enough medication to last until your next medication refill appointment. 5. Taking Medication: Take medication as prescribed. Never take more pills than instructed. Never take medication more frequently than prescribed. Taking less pills or less frequently is permitted and encouraged, when it comes to controlled substances (written prescriptions).  6. Inform other Doctors: Always inform, all of your healthcare providers, of all the medications you take. 7. Pain Medication from other Providers: You are not allowed to accept any additional pain medication from any other Doctor or Healthcare provider. There are two exceptions to this rule. (see below) In the event that you require additional pain medication, you are responsible for notifying us, as stated below. 8. Medication Agreement: You are responsible for carefully reading and following our Medication Agreement. This must be signed before receiving any prescriptions from our practice. Safely store a copy of your signed Agreement. Violations to the Agreement will result in no further prescriptions. (Additional copies of our Medication Agreement are available upon request.) 9. Laws, Rules, & Regulations: All patients are expected to follow all [address] and [employer], Rules, [place] [employer]. Ignorance of the Laws does not constitute a valid excuse.  Exceptions: There are only two exceptions to the rule of not receiving pain medications from other Healthcare Providers. 1. Exception #1 (Emergencies): In the event of an emergency (i.e.: accident requiring emergency care), you are allowed to receive additional pain medication. However, you are responsible for: As soon as you are able, call our office [phone], at any time of the day or night, and leave a message stating your name, the date and nature of the emergency, and the name and dose of the medication  prescribed. In the event that your call is answered by a member of our staff, make sure to document and save the date, time, and the name of the person that took your information.  2. Exception #2 (Planned Surgery): In the event that you are scheduled by another doctor or dentist to have any type of surgery or procedure, you are allowed (for a period no longer than 30 days), to receive additional pain medication, for the acute post-op pain. However, in this case, you are responsible for picking up a copy of our "Post-op Pain Management for Surgeons" handout, and giving it to your surgeon or dentist. This document is available at our office, and does not require an appointment to obtain it. Simply go to our office during business hours (Monday-Thursday from 8:00 AM to 4:00 PM) (Friday 8:00 AM to 12:00 Noon) or  if you have a scheduled appointment with [place], prior to your surgery, and ask for it by name. In addition, you will need to provide [place] with your name, name of your surgeon, type of surgery, and date of procedure or surgery.  ____________________________________________________________________________________________

## 2017-04-05 ENCOUNTER — Ambulatory Visit: Payer: Medicare PPO | Attending: Pain Medicine | Admitting: Pain Medicine

## 2017-04-05 ENCOUNTER — Encounter: Payer: Self-pay | Admitting: Pain Medicine

## 2017-04-05 ENCOUNTER — Other Ambulatory Visit: Payer: Self-pay | Admitting: Pain Medicine

## 2017-04-05 VITALS — BP 133/73 | HR 63 | Temp 98.0°F | Resp 16 | Ht 69.0 in | Wt 294.3 lb

## 2017-04-05 DIAGNOSIS — F329 Major depressive disorder, single episode, unspecified: Secondary | ICD-10-CM | POA: Insufficient documentation

## 2017-04-05 DIAGNOSIS — N529 Male erectile dysfunction, unspecified: Secondary | ICD-10-CM | POA: Diagnosis not present

## 2017-04-05 DIAGNOSIS — M79602 Pain in left arm: Secondary | ICD-10-CM

## 2017-04-05 DIAGNOSIS — M4696 Unspecified inflammatory spondylopathy, lumbar region: Secondary | ICD-10-CM | POA: Diagnosis not present

## 2017-04-05 DIAGNOSIS — F17220 Nicotine dependence, chewing tobacco, uncomplicated: Secondary | ICD-10-CM | POA: Diagnosis not present

## 2017-04-05 DIAGNOSIS — M545 Low back pain: Secondary | ICD-10-CM

## 2017-04-05 DIAGNOSIS — D649 Anemia, unspecified: Secondary | ICD-10-CM | POA: Insufficient documentation

## 2017-04-05 DIAGNOSIS — Z462 Encounter for fitting and adjustment of other devices related to nervous system and special senses: Secondary | ICD-10-CM | POA: Insufficient documentation

## 2017-04-05 DIAGNOSIS — M4802 Spinal stenosis, cervical region: Secondary | ICD-10-CM | POA: Insufficient documentation

## 2017-04-05 DIAGNOSIS — M47816 Spondylosis without myelopathy or radiculopathy, lumbar region: Secondary | ICD-10-CM

## 2017-04-05 DIAGNOSIS — I10 Essential (primary) hypertension: Secondary | ICD-10-CM | POA: Diagnosis not present

## 2017-04-05 DIAGNOSIS — G894 Chronic pain syndrome: Secondary | ICD-10-CM | POA: Diagnosis not present

## 2017-04-05 DIAGNOSIS — M542 Cervicalgia: Secondary | ICD-10-CM

## 2017-04-05 DIAGNOSIS — F1190 Opioid use, unspecified, uncomplicated: Secondary | ICD-10-CM

## 2017-04-05 DIAGNOSIS — M7581 Other shoulder lesions, right shoulder: Secondary | ICD-10-CM

## 2017-04-05 DIAGNOSIS — G90512 Complex regional pain syndrome I of left upper limb: Secondary | ICD-10-CM | POA: Diagnosis not present

## 2017-04-05 DIAGNOSIS — M5416 Radiculopathy, lumbar region: Secondary | ICD-10-CM | POA: Diagnosis not present

## 2017-04-05 DIAGNOSIS — Z9689 Presence of other specified functional implants: Secondary | ICD-10-CM | POA: Diagnosis not present

## 2017-04-05 DIAGNOSIS — Z79899 Other long term (current) drug therapy: Secondary | ICD-10-CM

## 2017-04-05 DIAGNOSIS — N50819 Testicular pain, unspecified: Secondary | ICD-10-CM | POA: Diagnosis not present

## 2017-04-05 DIAGNOSIS — K5903 Drug induced constipation: Secondary | ICD-10-CM

## 2017-04-05 DIAGNOSIS — T402X5A Adverse effect of other opioids, initial encounter: Secondary | ICD-10-CM

## 2017-04-05 DIAGNOSIS — E291 Testicular hypofunction: Secondary | ICD-10-CM

## 2017-04-05 DIAGNOSIS — Z79891 Long term (current) use of opiate analgesic: Secondary | ICD-10-CM

## 2017-04-05 DIAGNOSIS — G4730 Sleep apnea, unspecified: Secondary | ICD-10-CM | POA: Insufficient documentation

## 2017-04-05 DIAGNOSIS — G4700 Insomnia, unspecified: Secondary | ICD-10-CM | POA: Insufficient documentation

## 2017-04-05 DIAGNOSIS — G8929 Other chronic pain: Secondary | ICD-10-CM

## 2017-04-05 NOTE — Patient Instructions (Signed)
____________________________________________________________________________________________  Appointment Policy Summary  It is our goal and responsibility to provide the medical community with assistance in the evaluation and management of patients with chronic pain. Unfortunately our resources are limited. Because we do not have an unlimited amount of time, or available appointments, we are required to closely monitor and manage their use. The following rules exist to maximize their use:  Patient's responsibilities: 1. Punctuality: You are required to be physically present and registered in our facility at least 30 minutes before your appointment. 2. Tardiness: The cutoff is your appointment time. If you have an appointment scheduled for 10:00 AM and you arrive at 10:01, you will be required to reschedule your appointment.  3. Plan ahead: Always assume that you will encounter traffic on your way in. Plan for it. If you are dependent on a driver, make sure they understand these rules and the need to arrive early. 4. Other appointments and responsibilities: Avoid scheduling any other appointments before or after your pain clinic appointments.  5. Be prepared: Write down everything that you need to discuss with your healthcare provider and give this information to the admitting nurse. Write down the medications that you will need refilled. Bring your pills and bottles (even the empty ones), to all of your appointments, except for those where a procedure is scheduled. 6. No children or pets: Find someone to take care of them. It is not appropriate to bring them in. 7. Scheduling changes: We request "advanced notification" of any changes or cancellations. 8. Advanced notification: Defined as a time period of more than 24 hours prior to the originally scheduled appointment. This allows for the appointment to be offered to other patients. 9. Rescheduling: When a visit is rescheduled, it will require the  cancellation of the original appointment. For this reason they both fall within the category of "Cancellations".  10. Cancellations: They require advanced notification. Any cancellation less than 24 hours before the  appointment will be recorded as a "No Show". 11. No Show: Defined as an unkept appointment where the patient failed to notify or declare to the practice their intention or inability to keep the appointment.  Corrective process for repeat offenders:  1. Tardiness: Three (3) episodes of rescheduling due to late arrivals will be recorded as one (1) "No Show". 2. Cancellation or reschedule: Three (3) cancellations or rescheduling will be recorded as one (1) "No Show". 3. "No Shows": Three (3) "No Shows" within a 12 month period will result in discharge from the practice.  ____________________________________________________________________________________________  ____________________________________________________________________________________________  Pain Scale  Introduction: The pain score used by this practice is the Verbal Numerical Rating Scale (VNRS-11). This is an 11-point scale. It is for adults and children 10 years or older. There are significant differences in how the pain score is reported, used, and applied. Forget everything you learned in the past and learn this scoring system.  General Information: The scale should reflect your current level of pain. Unless you are specifically asked for the level of your worst pain, or your average pain. If you are asked for one of these two, then it should be understood that it is over the past 24 hours.  Basic Activities of Daily Living (ADL): Personal hygiene, dressing, eating, transferring, and using restroom.  Instructions: Most patients tend to report their level of pain as a combination of two factors, their physical pain and their psychosocial pain. This last one is also known as suffering and it is reflection of how  physical  pain affects you socially and psychologically. From now on, report them separately. From this point on, when asked to report your pain level, report only your physical pain. Use the following table for reference.  Pain Clinic Pain Levels (0-5/10)  Pain Level Score  Description  No Pain 0   Mild pain 1 Nagging, annoying, but does not interfere with basic activities of daily living (ADL). Patients are able to eat, bathe, get dressed, toileting (being able to get on and off the toilet and perform personal hygiene functions), transfer (move in and out of bed or a chair without assistance), and maintain continence (able to control bladder and bowel functions). Blood pressure and heart rate are unaffected. A normal heart rate for a healthy adult ranges from 60 to 100 bpm (beats per minute).   Mild to moderate pain 2 Noticeable and distracting. Impossible to hide from other people. More frequent flare-ups. Still possible to adapt and function close to normal. It can be very annoying and may have occasional stronger flare-ups. With discipline, patients may get used to it and adapt.   Moderate pain 3 Interferes significantly with activities of daily living (ADL). It becomes difficult to feed, bathe, get dressed, get on and off the toilet or to perform personal hygiene functions. Difficult to get in and out of bed or a chair without assistance. Very distracting. With effort, it can be ignored when deeply involved in activities.   Moderately severe pain 4 Impossible to ignore for more than a few minutes. With effort, patients may still be able to manage work or participate in some social activities. Very difficult to concentrate. Signs of autonomic nervous system discharge are evident: dilated pupils (mydriasis); mild sweating (diaphoresis); sleep interference. Heart rate becomes elevated (>115 bpm). Diastolic blood pressure (lower number) rises above 100 mmHg. Patients find relief in laying down and not  moving.   Severe pain 5 Intense and extremely unpleasant. Associated with frowning face and frequent crying. Pain overwhelms the senses.  Ability to do any activity or maintain social relationships becomes significantly limited. Conversation becomes difficult. Pacing back and forth is common, as getting into a comfortable position is nearly impossible. Pain wakes you up from deep sleep. Physical signs will be obvious: pupillary dilation; increased sweating; goosebumps; brisk reflexes; cold, clammy hands and feet; nausea, vomiting or dry heaves; loss of appetite; significant sleep disturbance with inability to fall asleep or to remain asleep. When persistent, significant weight loss is observed due to the complete loss of appetite and sleep deprivation.  Blood pressure and heart rate becomes significantly elevated. Caution: If elevated blood pressure triggers a pounding headache associated with blurred vision, then the patient should immediately seek attention at an urgent or emergency care unit, as these may be signs of an impending stroke.    Emergency Department Pain Levels (6-10/10)  Emergency Room Pain 6 Severely limiting. Requires emergency care and should not be seen or managed at an outpatient pain management facility. Communication becomes difficult and requires great effort. Assistance to reach the emergency department may be required. Facial flushing and profuse sweating along with potentially dangerous increases in heart rate and blood pressure will be evident.   Distressing pain 7 Self-care is very difficult. Assistance is required to transport, or use restroom. Assistance to reach the emergency department will be required. Tasks requiring coordination, such as bathing and getting dressed become very difficult.   Disabling pain 8 Self-care is no longer possible. At this level, pain is disabling. The individual  is unable to do even the most basic activities such as walking, eating, bathing,  dressing, transferring to a bed, or toileting. Fine motor skills are lost. It is difficult to think clearly.   Incapacitating pain 9 Pain becomes incapacitating. Thought processing is no longer possible. Difficult to remember your own name. Control of movement and coordination are lost.   The worst pain imaginable 10 At this level, most patients pass out from pain. When this level is reached, collapse of the autonomic nervous system occurs, leading to a sudden drop in blood pressure and heart rate. This in turn results in a temporary and dramatic drop in blood flow to the brain, leading to a loss of consciousness. Fainting is one of the bodys self defense mechanisms. Passing out puts the brain in a calmed state and causes it to shut down for a while, in order to begin the healing process.    Summary: 1. Refer to this scale when providing [place] with your pain level. 2. Be accurate and careful when reporting your pain level. This will help with your care. 3. Over-reporting your pain level will lead to loss of credibility. 4. Even a level of 1/10 means that there is pain and will be treated at our facility. 5. High, inaccurate reporting will be documented as Symptom Exaggeration, leading to loss of credibility and suspicions of possible secondary gains such as obtaining more narcotics, or wanting to appear disabled, for fraudulent reasons. 6. Only pain levels of 5 or below will be seen at our facility. 7. Pain levels of 6 and above will be sent to the Emergency Department and the appointment cancelled. ____________________________________________________________________________________________  ____________________________________________________________________________________________  DRUG HOLIDAYS  Definitions Tolerance: defined as the progressively decreased responsiveness to a drug. Occurs when the drug is used repeatedly and the body adapts to the continued presence of the drug. As a result,  a larger dose of the drug is needed to achieve the effect originally obtained by a smaller dose. It is thought to be due to the formation of excess opioid receptors.  Drug Holiday: is when a patient stops taking a medication(s) for a period of time; anywhere from a few days to several weeks.  Withdrawals: refers to the wide range of symptoms that occur after stopping or dramatically reducing opiate drugs after heavy and prolonged use. Withdrawal symptoms do not occur to patients that use low dose opioids, or those who take the medication sporadically. Contrary to benzodiazepine (example: Valium, Xanax, etc.) or alcohol withdrawals (Delirium Tremens), opioid withdrawals are not lethal. Withdrawals are the physical manifestation of the body getting rid of the excess receptors.  Purpose To eliminate tolerance.  Duration of Holiday 14 consecutive days. (2 weeks)  Expected Symptoms Early symptoms of withdrawal include:  Agitation  Anxiety  Muscle aches  Increased tearing  Insomnia  Runny nose  Sweating  Yawning  Late symptoms of withdrawal include:  Abdominal cramping  Diarrhea  Dilated pupils  Goose bumps  Nausea  Vomiting  Opioid withdrawal reactions are very uncomfortable but are not life-threatening. Symptoms usually start within 12 hours of last opioid dose and within 30 hours of last methadone exposure.  Duration of Symptoms 48 to 72 hours for short acting medications and 2 to 14 days for methadone.  Treatment  Clonidine (Catapres) or tizanidine (Zanaflex) for agitation, sweating, tearing, runny nose.  Promethazine (Phenergan) for nausea, vomiting.  NSAIDs for pain.  Benefits  Improved effectiveness of opioids.  Decreased opioid dose needed to achieve benefits.  Improved pain with lesser  dose. ____________________________________________________________________________________________   ____________________________________________________________________________________________  Medication Rules  Applies to: All patients receiving prescriptions (written or electronic).  Pharmacy of record: Pharmacy where electronic prescriptions will be sent. If written prescriptions are taken to a different pharmacy, please inform the nursing staff. The pharmacy listed in the electronic medical record should be the one where you would like electronic prescriptions to be sent.  Prescription refills: Only during scheduled appointments. Applies to both, written and electronic prescriptions.  NOTE: The following applies primarily to controlled substances (Opioid Pain Medications)  Patient's responsibilities: 1. Pain Pills: Bring all pain pills to every appointment (except for procedure appointments). 2. Pill Bottles: Bring pills in original pharmacy bottle. Always bring newest bottle. Bring bottle, even if empty. 3. Medication refills: You are responsible for knowing and keeping track of what medications you need refilled. The day before your appointment, write a list of all prescriptions that need to be refilled. Bring that list to your appointment and give it to the admitting nurse. Prescriptions will be written only during appointments. If you forget a medication, it will not be "Called in", "Faxed", or "electronically sent". You will need to get another appointment to get these prescribed. 4. Prescription Accuracy: You are responsible for carefully inspecting your prescriptions before leaving our office. Have the discharge nurse carefully go over each prescription with you, before taking them home. Make sure that your name is accurately spelled, that your address is correct. Check the name and dose of your medication to make sure it is accurate. Check the number of pills, and the written  instructions to make sure they are clear and accurate. Make sure that you are given enough medication to last until your next medication refill appointment. 5. Taking Medication: Take medication as prescribed. Never take more pills than instructed. Never take medication more frequently than prescribed. Taking less pills or less frequently is permitted and encouraged, when it comes to controlled substances (written prescriptions).  6. Inform other Doctors: Always inform, all of your healthcare providers, of all the medications you take. 7. Pain Medication from other Providers: You are not allowed to accept any additional pain medication from any other Doctor or Healthcare provider. There are two exceptions to this rule. (see below) In the event that you require additional pain medication, you are responsible for notifying us, as stated below. 8. Medication Agreement: You are responsible for carefully reading and following our Medication Agreement. This must be signed before receiving any prescriptions from our practice. Safely store a copy of your signed Agreement. Violations to the Agreement will result in no further prescriptions. (Additional copies of our Medication Agreement are available upon request.) 9. Laws, Rules, & Regulations: All patients are expected to follow all [address]Federal and [place] Disney[place] Laws, [employer]utes, Rules,  [place] Regulations. Ignorance of the Laws does not constitute a valid excuse.  Exceptions: There are only two exceptions to the rule of not receiving pain medications from other Healthcare Providers. 1. Exception #1 (Emergencies): In the event of an emergency (i.e.: accident requiring emergency care), you are allowed to receive additional pain medication. However, you are responsible for: As soon as you are able, call our office [phone], at any time of the day or night, and leave a message stating your name, the date and nature of the emergency, and the name and dose of the medication  prescribed. In the event that your call is answered by a member of our staff, make sure to document and save the  date, time, and the name of the person that took your information.  2. Exception #2 (Planned Surgery): In the event that you are scheduled by another doctor or dentist to have any type of surgery or procedure, you are allowed (for a period no longer than 30 days), to receive additional pain medication, for the acute post-op pain. However, in this case, you are responsible for picking up a copy of our "Post-op Pain Management for Surgeons" handout, and giving it to your surgeon or dentist. This document is available at our office, and does not require an appointment to obtain it. Simply go to our office during business hours (Monday-Thursday from 8:00 AM to 4:00 PM) (Friday 8:00 AM to 12:00 Noon) or if you have a scheduled appointment with [place], prior to your surgery, and ask for it by name. In addition, you will need to provide [place] with your name, name of your surgeon, type of surgery, and date of procedure or surgery.  ____________________________________________________________________________________________

## 2017-04-05 NOTE — Progress Notes (Signed)
Safety precautions to be maintained throughout the outpatient stay will include: orient to surroundings, keep bed in low position, maintain call bell within reach at all times, provide assistance with transfer out of bed and ambulation.  

## 2017-04-06 DIAGNOSIS — M47816 Spondylosis without myelopathy or radiculopathy, lumbar region: Secondary | ICD-10-CM | POA: Insufficient documentation

## 2017-04-06 DIAGNOSIS — Z79899 Other long term (current) drug therapy: Secondary | ICD-10-CM | POA: Insufficient documentation

## 2017-04-06 DIAGNOSIS — T402X5A Adverse effect of other opioids, initial encounter: Secondary | ICD-10-CM

## 2017-04-06 DIAGNOSIS — K5903 Drug induced constipation: Secondary | ICD-10-CM | POA: Insufficient documentation

## 2017-04-06 HISTORY — DX: Drug induced constipation: K59.03

## 2017-04-08 LAB — 25-HYDROXY VITAMIN D LCMS D2+D3
25-Hydroxy, Vitamin D-2: 8.1 ng/mL
25-Hydroxy, Vitamin D: 38 ng/mL

## 2017-04-08 LAB — COMPREHENSIVE METABOLIC PANEL
A/G RATIO: 2 (ref 1.2–2.2)
ALT: 24 IU/L (ref 0–44)
AST: 16 IU/L (ref 0–40)
Albumin: 4.5 g/dL (ref 3.5–5.5)
Alkaline Phosphatase: 82 IU/L (ref 39–117)
BILIRUBIN TOTAL: 0.3 mg/dL (ref 0.0–1.2)
BUN/Creatinine Ratio: 22 — ABNORMAL HIGH (ref 9–20)
BUN: 15 mg/dL (ref 6–24)
CHLORIDE: 102 mmol/L (ref 96–106)
CO2: 25 mmol/L (ref 20–29)
Calcium: 9.5 mg/dL (ref 8.7–10.2)
Creatinine, Ser: 0.68 mg/dL — ABNORMAL LOW (ref 0.76–1.27)
GFR calc non Af Amer: 114 mL/min/{1.73_m2} (ref >59)
GFR, EST AFRICAN AMERICAN: 131 mL/min/{1.73_m2} (ref >59)
GLOBULIN, TOTAL: 2.2 g/dL (ref 1.5–4.5)
Glucose: 71 mg/dL (ref 65–99)
POTASSIUM: 4.1 mmol/L (ref 3.5–5.2)
SODIUM: 144 mmol/L (ref 134–144)
TOTAL PROTEIN: 6.7 g/dL (ref 6.0–8.5)

## 2017-04-08 LAB — VITAMIN B12: Vitamin B-12: 438 pg/mL (ref 232–1245)

## 2017-04-08 LAB — MAGNESIUM: Magnesium: 2.1 mg/dL (ref 1.6–2.3)

## 2017-04-08 LAB — 25-HYDROXYVITAMIN D LCMS D2+D3: 25-HYDROXY, VITAMIN D-3: 30 ng/mL

## 2017-04-08 LAB — COMPLIANCE DRUG ANALYSIS, UR

## 2017-04-08 LAB — C-REACTIVE PROTEIN: CRP: 5.7 mg/L — AB (ref 0.0–4.9)

## 2017-04-08 LAB — SEDIMENTATION RATE: Sed Rate: 10 mm/hr (ref 0–15)

## 2017-04-20 ENCOUNTER — Ambulatory Visit
Admission: RE | Admit: 2017-04-20 | Discharge: 2017-04-20 | Disposition: A | Discharge status: Home / Self Care | Payer: BLUE CROSS/BLUE SHIELD | Source: Ambulatory Visit | Attending: Pain Medicine | Admitting: Pain Medicine

## 2017-04-20 DIAGNOSIS — M542 Cervicalgia: Secondary | ICD-10-CM | POA: Insufficient documentation

## 2017-04-20 DIAGNOSIS — M79602 Pain in left arm: Secondary | ICD-10-CM | POA: Diagnosis not present

## 2017-04-20 DIAGNOSIS — M4802 Spinal stenosis, cervical region: Secondary | ICD-10-CM | POA: Insufficient documentation

## 2017-04-20 DIAGNOSIS — M7581 Other shoulder lesions, right shoulder: Secondary | ICD-10-CM

## 2017-04-20 DIAGNOSIS — G8929 Other chronic pain: Secondary | ICD-10-CM | POA: Insufficient documentation

## 2017-04-20 DIAGNOSIS — M545 Low back pain: Secondary | ICD-10-CM

## 2017-04-25 NOTE — Progress Notes (Signed)
Patient's Name: Ian Morse  MRN: [id number]  Referring Provider: Dellia Beckwith, *  DOB: June 07, 1971  PCP: Dellia Beckwith, PA-C  DOS: 04/26/2017  Note by: Gaspar Cola, MD  Service setting: Ambulatory outpatient  Specialty: Interventional Pain Management  Location: [facility] (AMB) Pain Management Facility    Patient type: Established   Primary Reason(s) for Visit: Encounter for evaluation before starting new chronic pain management plan of care (Level of risk: moderate) CC: Hand Pain (left) and Neck Pain  HPI  Ian Morse is a 46 y.o. year old, male patient, who comes today for a follow-up evaluation to review the test results and decide on a treatment plan. 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); Long term (current) use of opiate analgesic (nonstop since 04/11/2011); Long term prescription opiate use; Opiate use (150 MME/Day); Chronic upper extremity pain (Location of Primary Source of Pain) (Left); Chronic neck pain (Location of Secondary source of pain) (Bilateral) (L>R); Chronic testicular pain (Location of Tertiary source of pain) (Bilateral) (L>R); Chronic low back pain (Bilateral) (L>R); Spinal cord stimulator status; Encounter for interrogation of neurostimulator; History of Chronic CRPS of upper extremity (Left); Lumbar facet syndrome (Bilateral) (L>R); Opioid-induced constipation (OIC); Long term prescription benzodiazepine use (since before 04/11/2011); and Chronic shoulder pain (Right) on his problem list. His primarily concern today is the Hand Pain (left) and Neck Pain  Pain Assessment: Location: Left Hand Radiating: up arm to shoulder neck-- right shoulder  pain does not radiate Onset: More than a month ago Duration: Chronic pain Quality: Aching, Constant, Stabbing (as it travels up it throbs, pins and needles, burning) Severity: 5 /10 (self-reported pain score)  Note: Reported level is inconsistent with clinical observations. Clinically the patient looks like a 3/10 Information on the proper use of the pain scale provided to the patient today Timing: Constant Modifying factors: spinal cord stimulator  Ian Morse comes in today for a follow-up visit after his initial evaluation on 04/05/2017. Today we went over the results of his tests. These were explained in "Layman's terms". During today's appointment we went over my diagnostic impression, as well as the proposed treatment plan.  In considering the treatment plan options, Ian Morse was reminded that I no longer take patients for medication management only. I asked him to let me know if he had no intention of taking advantage of the interventional therapies, so that we could make arrangements to provide this space to someone interested. I also made it clear that undergoing interventional therapies for the purpose of getting pain medications is very inappropriate on the part of a patient, and it will not be tolerated in this practice. This type of behavior would suggest true addiction and therefore it requires referral to an addiction specialist.   Further details on both, my assessment(s), as well as the proposed treatment plan, please see below.  Controlled Substance Pharmacotherapy Assessment REMS (Risk Evaluation and Mitigation Strategy)  Analgesic: morphine ER 30 mg 1 tablet by mouth twice a day (60 mg/day of morphine) + oxycodone IR 15 mg 1 tablet by mouth every 6 hours (60 mg/day of oxycodone) (150 MME/Day) Highest recorded MME/day:  210 mg/day MME/day: 150 mg/day Pill Count: None expected due to no prior prescriptions written by our practice. Ian Specking, RN  04/26/2017  2:21 PM  Sign  at close encounter Safety precautions to be maintained throughout the outpatient stay will include: orient to surroundings, keep bed in low position, maintain call bell within reach at all times, provide assistance with transfer out of bed and ambulation.    Pharmacokinetics: Liberation and absorption (onset of action): WNL Distribution (time to peak effect): WNL Metabolism and excretion (duration of action): WNL         Pharmacodynamics: Desired effects: Analgesia: Ian Morse reports >50% benefit. Functional ability: Patient reports that medication allows him to accomplish basic ADLs Clinically meaningful improvement in function (CMIF): Sustained CMIF goals met Perceived effectiveness: Described as relatively effective, allowing for increase in activities of daily living (ADL) Undesirable effects: Side-effects or Adverse reactions: None reported Monitoring: [place] PMP: Online review of the past 22-monthperiod previously conducted. Not applicable at this point since we have not taken over the patient's medication management yet. List of all Serum Drug Screening Test(s):  No results found for: AMPHSCRSER, BARBSCRSER, BENZOSCRSER, COCAINSCRSER, PCPSCRSER, THCSCRSER, OPIATESCRSER, [place] UDS test(s) done:  Lab Results  Component Value Date   SUMMARY FINAL 04/05/2017   Last UDS on record: Summary  Date Value Ref Range Status  04/05/2017 FINAL  Final    Comment:    ==================================================================== TOXASSURE COMP DRUG ANALYSIS,UR ==================================================================== Test                             Result       Flag       Units Drug Present and Declared for Prescription Verification   Morphine                       >8197        EXPECTED   ng/mg creat    Potential sources of large amounts of morphine in the absence of    codeine include administration of morphine or use of heroin.   Oxycodone                       730          EXPECTED   ng/mg creat   Oxymorphone                    330          EXPECTED   ng/mg creat   Noroxycodone                   4178         EXPECTED   ng/mg creat   Noroxymorphone                 179          EXPECTED   ng/mg creat    Sources of oxycodone are scheduled prescription medications.    Oxymorphone, noroxycodone, and noroxymorphone are expected    metabolites of oxycodone. Oxymorphone is also available as a    scheduled prescription medication.   Gabapentin                     PRESENT      EXPECTED   Trazodone                      PRESENT      EXPECTED   1,3 chlorophenyl piperazine    PRESENT  EXPECTED    1,3-chlorophenyl piperazine is an expected metabolite of    trazodone.   Acetaminophen                  PRESENT      EXPECTED Drug Present not Declared for Prescription Verification   Salicylate                     PRESENT      UNEXPECTED Drug Absent but Declared for Prescription Verification   Butalbital                     Not Detected UNEXPECTED   Duloxetine                     Not Detected UNEXPECTED   Fluoxetine                     Not Detected UNEXPECTED ==================================================================== Test                      Result    Flag   Units      Ref Range   Creatinine              122              mg/dL      >=20 ==================================================================== Declared Medications:  The flagging and interpretation on this report are based on the  following declared medications.  Unexpected results may arise from  inaccuracies in the declared medications.  **Note: The testing scope of this panel includes these medications:  Butalbital (Butalbital/APAP/Caffeine)  Duloxetine  Fluoxetine  Gabapentin  Morphine (Morphine Sulfate)  Oxycodone  Trazodone  **Note: The testing scope of this panel does not include small to  moderate amounts of these reported medications:  Acetaminophen  (Butalbital/APAP/Caffeine)  **Note: The testing scope of this panel does not include following  reported medications:  Albuterol  Caffeine (Butalbital/APAP/Caffeine)  Calcium Carbonate (Calcium Carb/Vitamin D)  Cyanocobalamin  Fluticasone  Herbal Product  Hydrochlorothiazide (Lisinopril-HCTZ)  Lactulose  Lisinopril (Lisinopril-HCTZ)  Magnesium (Mag)  Multivitamin (MVI)  Potassium  Supplement (Krill Oil)  Testosterone  Turmeric  Vitamin D (Calcium Carb/Vitamin D) ==================================================================== For clinical consultation, please call [phone]. ====================================================================    UDS interpretation: Unexpected findings not considered significantly abnormal          Medication Assessment Form: Patient introduced to form today Treatment compliance: Treatment may start today if patient agrees with proposed plan. Evaluation of compliance is not applicable at this point Risk Assessment Profile: Aberrant behavior: See initial evaluations. None observed or detected today Comorbid factors increasing risk of overdose: See initial evaluation. No additional risks detected today Risk Mitigation Strategies:  Patient opioid safety counseling: Completed today. Counseling provided to patient as per "Patient Counseling Document". Document signed by patient, attesting to counseling and understanding Patient-Prescriber Agreement (PPA): Obtained today.  Controlled substance notification to other providers: Written and sent today.  Pharmacologic Plan: Today we may be taking over the patient's pharmacological regimen. See below             Laboratory Chemistry  Inflammation Markers (CRP: Acute Phase) (ESR: Chronic Phase) Lab Results  Component Value Date   CRP 5.7 (H) 04/05/2017   ESRSEDRATE 10 04/05/2017                 Renal Function Markers Lab Results  Component Value Date   BUN 15 04/05/2017  CREATININE 0.68  (L) 04/05/2017   GFRAA 131 04/05/2017   GFRNONAA 114 04/05/2017                 Hepatic Function Markers Lab Results  Component Value Date   AST 16 04/05/2017   ALT 24 04/05/2017   ALBUMIN 4.5 04/05/2017   ALKPHOS 82 04/05/2017                 Electrolytes Lab Results  Component Value Date   NA 144 04/05/2017   K 4.1 04/05/2017   CL 102 04/05/2017   CALCIUM 9.5 04/05/2017   MG 2.1 04/05/2017                 Neuropathy Markers Lab Results  Component Value Date   VITAMINB12 438 04/05/2017                 Bone Pathology Markers Lab Results  Component Value Date   ALKPHOS 82 04/05/2017   25OHVITD1 38 04/05/2017   25OHVITD2 8.1 04/05/2017   25OHVITD3 30 04/05/2017   CALCIUM 9.5 04/05/2017                 Coagulation Parameters Lab Results  Component Value Date   PLT 316 08/21/2014                 Cardiovascular Markers Lab Results  Component Value Date   HGB 12.4 (L) 08/21/2014   HCT 37.8 (L) 08/21/2014                 Note: Lab results reviewed.  Recent Diagnostic Imaging Review  Dg Cervical Spine Complete Result Date: 04/20/2017 CLINICAL DATA:  Neck pain EXAM: CERVICAL SPINE - COMPLETE 4+ VIEW COMPARISON:  None. FINDINGS: There is no evidence of cervical spine fracture or prevertebral soft tissue swelling. The atlantodental interval is within normal limits. No splaying of the lateral masses of C1 on C2. The odontoid process appears intact. Neural stimulator lead projects up to the C4-5 disc level within the central canal posteriorly. Normal cervical lordosis. Slight disc space narrowing anteriorly at C6-7. Facet joints are maintained. No other significant bone abnormalities are identified. IMPRESSION: 1. Slight disc space narrowing at C6-7. 2. Neural stimulator lead projects up to the C4-5 disc level. 3. No acute osseous abnormality. Electronically Signed   By: Ashley Royalty M.D.   On: 04/20/2017 13:46   Dg Lumbar Spine Complete W/bend Result Date:  04/20/2017 CLINICAL DATA:  Low back pain EXAM: LUMBAR SPINE - COMPLETE WITH BENDING VIEWS COMPARISON:  CT 05/18/2006 FINDINGS: Lumbosacral transitional vertebra with S1-S2 disc demonstrating grade 1 anterolisthesis of up to 6 mm involving S1 on S2 on the neutral and flexion lateral projections which reduces upon extension. No spondylolysis is identified. Findings are likely on the basis of degenerative facet arthropathy which are noted at L4-5 and L5-S1. A neurostimulator generator projects over the posterior elements at L4 and L5. No acute fracture nor suspicious osseous lesions. IMPRESSION: 1. Lumbosacral transitional vertebra with S1-S2 disc. 2. Grade 1 anterolisthesis of S1 on S2 in the neutral and flexion lateral views which appears to reduce upon extension. Electronically Signed   By: Ashley Royalty M.D.   On: 04/20/2017 13:55   Dg Shoulder Right Result Date: 04/20/2017 CLINICAL DATA:  Right shoulder pain x1 year without known injury EXAM: RIGHT SHOULDER - 2+ VIEW COMPARISON:  None. FINDINGS: There is no evidence of fracture or dislocation. Neural stimulator leads project up to the mid cervical spine  to the C4-5 disc. There is no evidence of arthropathy or other focal bone abnormality. Soft tissues are unremarkable. IMPRESSION: Negative. Electronically Signed   By: Ashley Royalty M.D.   On: 04/20/2017 13:58   Cervical Imaging: Cervical MR wo contrast:  Results for orders placed in visit on 04/06/05  MR [facility] W/O Cm   Narrative * PRIOR REPORT IMPORTED FROM AN EXTERNAL SYSTEM *   PRIOR REPORT IMPORTED FROM THE [facility] WORKFLOW SYSTEM   REASON FOR EXAM:  Chronic neck and shoulder pain as well as upper  extremities  COMMENTS:   PROCEDURE:     MR  - MR CERVICAL SPINE WO CONT  - Apr 06 2005 10:27AM   RESULT:     Multisequence/multiplanar imaging of the cervical spine was  performed without contrast.  No evidence of abnormal signal is noted  within  the brachium pontis, brainstem, or proximal spinal  cord.  The cervical  spine  aligns anatomically without evidence of abnormal osseous signal.  There is  disk desiccation and mild intervertebral disk space narrowing throughout  the  cervical spine.  The prevertebral soft tissues and posterior elements are  unremarkable.  The axial images show no evidence of neural foraminal  narrowing or central canal stenosis at C2-3 or C3-4.  At C4-5 there is a  small posterior spur which is impinging upon the anterior aspect of the  thecal sac and causing some flattening but there is no neural foraminal  narrowing or central canal stenosis.  At C5-6 and C6-7 there are prominent  posterior uncovertebral spurs along with central subligamentous disk  bulging.  These are combining to flatten the anterior aspect of the thecal  sac.  I do not see obvious neural foraminal narrowing at these levels but  there is borderline central canal stenosis at C5-6 due to these arthritic  changes with the AP diameter of the central canal measuring only 1 cm.  There is a normal relationship between C7 and T1.   IMPRESSION:   There are posterior uncovertebral spurs at C5-6 and C6-7.  These in  combination with central subligamentous disk bulging are flattening the  anterior aspect of the thecal sac at both of these levels with borderline  central canal stenosis noted at C5-6 as the AP diameter measures only 1  cm.  No obvious neural foraminal narrowing is identified.   No definite HNP is noted.   No evidence of abnormal signal is noted within the cervical cord.   The prevertebral soft tissues and posterior elements are unremarkable.       Cervical DG complete:  Results for orders placed during the hospital encounter of 04/20/17  DG Cervical Spine Complete   Narrative CLINICAL DATA:  Neck pain  EXAM: CERVICAL SPINE - COMPLETE 4+ VIEW  COMPARISON:  None.  FINDINGS: There is no evidence of cervical spine fracture or prevertebral soft tissue swelling. The  atlantodental interval is within normal limits. No splaying of the lateral masses of C1 on C2. The odontoid process appears intact. Neural stimulator lead projects up to the C4-5 disc level within the central canal posteriorly. Normal cervical lordosis. Slight disc space narrowing anteriorly at C6-7. Facet joints are maintained. No other significant bone abnormalities are identified.  IMPRESSION: 1. Slight disc space narrowing at C6-7. 2. Neural stimulator lead projects up to the C4-5 disc level. 3. No acute osseous abnormality.   Electronically Signed   By: Ashley Royalty M.D.   On: 04/20/2017 13:46  Shoulder Imaging: Shoulder-R DG:  Results for orders placed during the hospital encounter of 04/20/17  DG Shoulder Right   Narrative CLINICAL DATA:  Right shoulder pain x1 year without known injury  EXAM: RIGHT SHOULDER - 2+ VIEW  COMPARISON:  None.  FINDINGS: There is no evidence of fracture or dislocation. Neural stimulator leads project up to the mid cervical spine to the C4-5 disc. There is no evidence of arthropathy or other focal bone abnormality. Soft tissues are unremarkable.  IMPRESSION: Negative.   Electronically Signed   By: Ashley Royalty M.D.   On: 04/20/2017 13:58    Thoracic Imaging: Thoracic MR wo contrast:  Results for orders placed in visit on 04/08/05  MR [place] W/O Cm   Narrative * PRIOR REPORT IMPORTED FROM AN EXTERNAL SYSTEM *   PRIOR REPORT IMPORTED FROM THE [facility] [place] FOR EXAM:  Neck and Shoulder Pain Chronic Upper Extr  COMMENTS:  Cell [id number] or Home [id number]   PROCEDURE:     MR  - MR THORACIC SPINE WO  - Apr 08 2005 10:26AM   RESULT:     Multiplanar and multisequence images of the thoracic spine are  obtained.  There is no evidence of disc protrusion or spinal stenosis.   The  neural foramen are patent.  No bony lesions are identified.  No paraspinal  lesions are identified.   IMPRESSION:     No  significant abnormalities are identified.       Thoracic CT w/wo contrast:  Results for orders placed in visit on 07/27/05  CT [facility] Wo Or W/ Cm   Narrative * PRIOR REPORT IMPORTED FROM AN EXTERNAL SYSTEM *   PRIOR REPORT IMPORTED FROM THE [facility] WORKFLOW SYSTEM   REASON FOR EXAM:  PTSB  COMMENTS:   PROCEDURE:     CT  - CT THORACIC SPINE WO  - Jul 27 2005  8:57AM   RESULT:     A needle was placed in the RIGHT paraspinal region at  approximately T3.  Contrast was injected adjacent to the vertebral body on  the RIGHT.  The procedure was performed by Dr. Dossie Arbour.   IMPRESSION:   1)See above.   Thank you for this opportunity to contribute to the care of your patient.       Lumbosacral Imaging: Lumbar CT wo contrast:  Results for orders placed in visit on 05/18/06  CT Lumbar Spine Wo Contrast   Narrative * PRIOR REPORT IMPORTED FROM AN EXTERNAL SYSTEM *   PRIOR REPORT IMPORTED FROM THE [facility] WORKFLOW SYSTEM   REASON FOR EXAM:  left lumbar radiculitis  COMMENTS:   PROCEDURE:     CT  - CT LUMBAR SPINE WO  - May 18 2006 12:45PM   RESULT:   HISTORY: LEFT lumbar radiculitis.   COMPARISON STUDIES: No recent.   PROCEDURE AND FINDINGS: Coronal and sagittal as well as axial images of  the  lumbar spine were obtained following lumbar myelography and reveal no  evidence of disc protrusion or spinal stenosis. The neural foramina are  patent. The lumbar cord and bony structures are normal.   IMPRESSION:   1)Negative exam.   Thank you for this opportunity to contribute to the care of your patient.        Lumbar DG Bending views:  Results for orders placed during the hospital encounter of 04/20/17  DG Lumbar Spine Complete W/Bend   Narrative CLINICAL DATA:  Low back pain  EXAM: LUMBAR SPINE - COMPLETE WITH BENDING VIEWS  COMPARISON:  CT 05/18/2006  FINDINGS: Lumbosacral transitional vertebra with S1-S2 disc demonstrating grade 1 anterolisthesis of up to 6 mm  involving S1 on S2 on the neutral and flexion lateral projections which reduces upon extension. No spondylolysis is identified. Findings are likely on the basis of degenerative facet arthropathy which are noted at L4-5 and L5-S1. A neurostimulator generator projects over the posterior elements at L4 and L5. No acute fracture nor suspicious osseous lesions.  IMPRESSION: 1. Lumbosacral transitional vertebra with S1-S2 disc. 2. Grade 1 anterolisthesis of S1 on S2 in the neutral and flexion lateral views which appears to reduce upon extension.   Electronically Signed   By: Ashley Royalty M.D.   On: 04/20/2017 13:55    Lumbar DG Myelogram views:  Results for orders placed in visit on 05/18/06  DG Myelogram Lumbar   Narrative * PRIOR REPORT IMPORTED FROM AN EXTERNAL SYSTEM *   PRIOR REPORT IMPORTED FROM THE [facility] WORKFLOW SYSTEM   REASON FOR EXAM:   Left lumbar radiculitis  COMMENTS:   PROCEDURE:     FL  - FL MYELOGRAM LUMBAR SPINE  - May 18 2006  9:45AM   RESULT:   HISTORY:  Back pain.   PROCEDURE AND FINDINGS:   Standard lumbar spine myelography is obtained  and  reveals contrast in the thecal sac with no evidence of spinal stenosis.  There is no evidence of nerve root entrapment.  The patient has a  neurostimulator.  The pedicles are intact.   IMPRESSION:   No evidence of spinal stenosis or significant disc protrusion.  Reference  is  made, however, to post myelography CT report.   Thank you for this opportunity to contribute to the care of your patient.       Note: Results of ordered imaging test(s) reviewed and explained to patient in Layman's terms. Copy of results provided to patient  Meds   Current Meds  Medication Sig   albuterol (PROAIR HFA) 108 (90 Base) MCG/ACT inhaler Inhale 2 puffs into the lungs as needed.   butalbital-acetaminophen-caffeine (FIORICET, ESGIC) 50-325-40 MG tablet Take 1 tablet by mouth every 4 (four) hours as needed for headache.    calcium-vitamin D (OSCAL WITH D) 500-200 MG-UNIT tablet Take 1 tablet by mouth 2 (two) times daily.   DULoxetine (CYMBALTA) 60 MG capsule Take 60 mg by mouth 2 (two) times daily.   fluticasone (FLONASE) 50 MCG/ACT nasal spray Place 2 sprays into the nose as needed.   gabapentin (NEURONTIN) 800 MG tablet Take 800 mg by mouth daily.   Ginkgo Biloba (GNP GINGKO BILOBA EXTRACT PO) Take by mouth.   KLOR-CON M10 10 MEQ tablet 1 tablet by Mouth Rinse route daily.   Krill Oil 500 MG CAPS Take by mouth.   lactulose (CHRONULAC) 10 GM/15ML solution Take 15-30 mLs by mouth once.   lisinopril-hydrochlorothiazide (PRINZIDE,ZESTORETIC) 10-12.5 MG tablet Take 1 tablet by mouth daily.   morphine (MS CONTIN) 30 MG 12 hr tablet Take 1 tablet (30 mg total) by mouth 2 (two) times daily.   Multiple Vitamins-Minerals (EYE VITAMINS PO) Take by mouth 2 (two) times daily.   Multiple Vitamins-Minerals (ICAPS AREDS 2 PO) Take by mouth.   oxyCODONE (ROXICODONE) 15 MG immediate release tablet Take 1 tablet (15 mg total) by mouth every 6 (six) hours as needed for pain.   Specialty Vitamins Products (MAGNESIUM, AMINO ACID CHELATE,) 133 MG tablet Take 1 tablet by mouth 2 (two) times  daily.   Testosterone (ANDROGEL PUMP) 20.25 MG/ACT (1.62%) GEL 20.25 mg.   traZODone (DESYREL) 150 MG tablet Take 150 mg by mouth every evening.   TURMERIC PO Take by mouth.   vitamin B-12 (CYANOCOBALAMIN) 100 MCG tablet Take 100 mcg by mouth daily.   [DISCONTINUED] morphine (MS CONTIN) 30 MG 12 hr tablet Take 30 mg by mouth 2 (two) times daily.   [DISCONTINUED] oxyCODONE (ROXICODONE) 15 MG immediate release tablet Take 15 mg by mouth as needed.    ROS  Constitutional: Denies any fever or chills Gastrointestinal: No reported hemesis, hematochezia, vomiting, or acute GI distress Musculoskeletal: Denies any acute onset joint swelling, redness, loss of ROM, or weakness Neurological: No reported episodes of acute onset apraxia,  aphasia, dysarthria, agnosia, amnesia, paralysis, loss of coordination, or loss of consciousness  Allergies  Mr. Nancarrow is allergic to lyrica [pregabalin].  [place]  Drug: Mr. Dials  reports that he does not use drugs. Alcohol:  reports that he does not drink alcohol. Tobacco:  reports that he has never smoked. His smokeless tobacco use includes Chew. Medical:  has a past medical history of Anxiety; Depression; Hypertension; MRSA carrier; and Neuromuscular disorder ([place]). Surgical: Mr. Gilliand  has a past surgical history that includes Spinal cord stimulator insertion and Surgery scrotal / testicular. Family: family history is not on file.  Constitutional Exam  General appearance: Well nourished, well developed, and well hydrated. In no apparent acute distress Vitals:   04/26/17 1407  BP: (!) 145/85  Pulse: 60  Resp: 16  Temp: 98.4 F (36.9 C)  SpO2: 100%  Weight: 285 lb (129.3 kg)  Height: 5' 9"  (1.753 m)   BMI Assessment: Estimated body mass index is 42.09 kg/m as calculated from the following:   Height as of this encounter: 5' 9"  (1.753 m).   Weight as of this encounter: 285 lb (129.3 kg).  BMI interpretation table: BMI level Category Range association with higher incidence of chronic pain  <18 kg/m2 Underweight   18.5-24.9 kg/m2 Ideal body weight   25-29.9 kg/m2 Overweight Increased incidence by 20%  30-34.9 kg/m2 Obese (Class I) Increased incidence by 68%  35-39.9 kg/m2 Severe obesity (Class II) Increased incidence by 136%  >40 kg/m2 Extreme obesity (Class III) Increased incidence by 254%   BMI Readings from Last 4 Encounters:  04/26/17 42.09 kg/m  04/05/17 43.46 kg/m   Wt Readings from Last 4 Encounters:  04/26/17 285 lb (129.3 kg)  04/05/17 294 lb 4.8 oz (133.5 kg)  Psych/Mental status: Alert, oriented x 3 (person, place, & time)       Eyes: PERLA Respiratory: No evidence of acute respiratory distress  Cervical Spine Exam  Inspection: No masses, redness, or  swelling Alignment: Symmetrical Functional ROM: Unrestricted ROM      Stability: No instability detected Muscle strength & Tone: Functionally intact Sensory: Unimpaired Palpation: No palpable anomalies              Upper Extremity (UE) Exam    Side: Right upper extremity  Side: Left upper extremity  Inspection: No masses, redness, swelling, or asymmetry. No contractures  Inspection: No masses, redness, swelling, or asymmetry. No contractures  Functional ROM: Unrestricted ROM          Functional ROM: Unrestricted ROM          Muscle strength & Tone: Functionally intact  Muscle strength & Tone: Functionally intact  Sensory: Unimpaired  Sensory: Unimpaired  Palpation: No palpable anomalies  Palpation: No palpable anomalies              Specialized Test(s): Deferred         Specialized Test(s): Deferred          Thoracic Spine Exam  Inspection: No masses, redness, or swelling Alignment: Symmetrical Functional ROM: Unrestricted ROM Stability: No instability detected Sensory: Unimpaired Muscle strength & Tone: No palpable anomalies  Lumbar Spine Exam  Inspection: No masses, redness, or swelling Alignment: Symmetrical Functional ROM: Unrestricted ROM      Stability: No instability detected Muscle strength & Tone: Functionally intact Sensory: Unimpaired Palpation: No palpable anomalies       Provocative Tests: Lumbar Hyperextension and rotation test: evaluation deferred today       Patrick's Maneuver: evaluation deferred today                    Gait & Posture Assessment  Ambulation: Unassisted Gait: Relatively normal for age and body habitus Posture: WNL   Lower Extremity Exam    Side: Right lower extremity  Side: Left lower extremity  Inspection: No masses, redness, swelling, or asymmetry. No contractures  Inspection: No masses, redness, swelling, or asymmetry. No contractures  Functional ROM: Unrestricted ROM          Functional ROM: Unrestricted ROM           Muscle strength & Tone: Functionally intact  Muscle strength & Tone: Functionally intact  Sensory: Unimpaired  Sensory: Unimpaired  Palpation: No palpable anomalies  Palpation: No palpable anomalies   Assessment & Plan  Primary Diagnosis & Pertinent Problem List: The primary encounter diagnosis was Chronic upper extremity pain (Location of Primary Source of Pain) (Left). 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  Visit Diagnosis: 1. Chronic upper extremity pain (Location of Primary Source of Pain) (Left)   2. Chronic neck pain (Location of Secondary source of pain) (Bilateral) (L>R)   3. 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 Chronic shoulder pain (Right)   5. Chronic pain syndrome   6. Long term (current) use of opiate analgesic (nonstop since 04/11/2011)   7. Long term prescription opiate use   8. Opiate use (150 MME/Day)    Problems updated and reviewed during this visit: No problems updated.  Plan of Care  Pharmacotherapy (Medications Ordered): Meds ordered this encounter  Medications   oxyCODONE (ROXICODONE) 15 MG immediate release tablet    Sig: Take 1 tablet (15 mg total) by mouth every 6 (six) hours as needed for pain.    Dispense:  120 tablet    Refill:  0    Fill one day early if pharmacy is closed on scheduled refill date. Do not fill until: 04/26/17 To last until: 05/26/17   morphine (MS CONTIN) 30 MG 12 hr tablet    Sig: Take 1 tablet (30 mg total) by mouth 2 (two) times daily.    Dispense:  60 tablet    Refill:  0    Fill one day early if pharmacy is closed on scheduled  refill date. Do not fill until: 04/26/17 To last until: 05/26/17   Lab-work, procedure(s), and/or referral(s): Orders Placed This Encounter  Procedures   SHOULDER INJECTION    Pharmacological management options:  Opioid Analgesics: We'll take over management today. See above orders Membrane stabilizer: We have discussed the possibility of optimizing this mode of therapy, if tolerated Muscle relaxant: We have discussed the possibility of a trial NSAID: We have discussed the possibility of a trial Other analgesic(s): To be determined at a later time   Interventional management options: Planned, scheduled, and/or pending:    Diagnostic right intra-articular shoulder joint injection under fluoroscopic guidance and IV sedation.  On the patient's next medication management visit, we will be switching him to oxycodone and start a tapering in order to accomplish a "drug holiday".    Considering:   Diagnostic right intra-articular shoulder joint injection  Diagnostic right suprascapular nerve block   Possible right suprascapular nerve RFA   Diagnostic left cervical epidural steroid injection  Diagnostic bilateral cervical facet block  Possible bilateral cervical facet RFA  Diagnostic bilateral lumbar facet block  Possible bilateral lumbar facet RFA    PRN Procedures:   To be determined at a later time   Provider-requested follow-up: Return for procedure (w/ sedation), (ASAP), by MD, in addition, Med-Mgmt, (1 mo), w/ MD.  Future Appointments Date Time Provider [place]  05/24/2017 2:00 PM Milinda Pointer, [place] None    Primary Care Physician: Dellia Beckwith, PA-C Location: [facility] Note by: Gaspar Cola, MD Date: 04/26/2017; Time: 3:19 PM  Patient Instructions   ____________________________________________________________________________________________  Medication Rules  Applies to: All patients receiving  prescriptions (written or electronic).  Pharmacy of record: Pharmacy where electronic prescriptions will be sent. If written prescriptions are taken to a different pharmacy, please inform the nursing staff. The pharmacy listed in the electronic medical record should be the one where you would like electronic prescriptions to be sent.  Prescription refills: Only during scheduled appointments. Applies to both, written and electronic prescriptions.  NOTE: The following applies primarily to controlled substances (Opioid* Pain Medications).   Patient's responsibilities: 1. Pain Pills: Bring all pain pills to every appointment (except for procedure appointments). 2. Pill Bottles: Bring pills in original pharmacy bottle. Always bring newest bottle. Bring bottle, even if empty. 3. Medication refills: You are responsible for knowing and keeping track of what medications you need refilled. The day before your appointment, write a list of all prescriptions that need to be refilled. Bring that list to your appointment and give it to the admitting nurse. Prescriptions will be written only during appointments. If you forget a medication, it will not be "Called in", "Faxed", or "electronically sent". You will need to get another appointment to get these prescribed. 4. Prescription Accuracy: You are responsible for carefully inspecting your prescriptions before leaving our office. Have the discharge nurse carefully go over each prescription with you, before taking them home. Make sure that your name is accurately spelled, that your address is correct. Check the name and dose of your medication to make sure it is accurate. Check the number of pills, and the written instructions to make sure they are clear and accurate. Make sure that you are given enough medication to last until your next medication refill appointment. 5. Taking Medication: Take medication as prescribed. Never take more pills than instructed. Never take  medication more frequently than prescribed. Taking less pills or less frequently is permitted and encouraged, when it comes to controlled substances (written prescriptions).  6. Inform other Doctors: Always inform, all of your healthcare providers, of all the  medications you take. 7. Pain Medication from other Providers: You are not allowed to accept any additional pain medication from any other Doctor or Healthcare provider. There are two exceptions to this rule. (see below) In the event that you require additional pain medication, you are responsible for notifying us, as stated below. 8. Medication Agreement: You are responsible for carefully reading and following our Medication Agreement. This must be signed before receiving any prescriptions from our practice. Safely store a copy of your signed Agreement. Violations to the Agreement will result in no further prescriptions. (Additional copies of our Medication Agreement are available upon request.) 9. Laws, Rules, & Regulations: All patients are expected to follow all Federal and [employer], [employer], Rules, [employer]. Ignorance of the Laws does not constitute a valid excuse. The use of any illegal substances is prohibited. 10. Adopted CDC guidelines & recommendations: Target dosing levels will be at or below 60 MME/day. Use of benzodiazepines** is not recommended.  Exceptions: There are only two exceptions to the rule of not receiving pain medications from other Healthcare Providers. 1. Exception #1 (Emergencies): In the event of an emergency (i.e.: accident requiring emergency care), you are allowed to receive additional pain medication. However, you are responsible for: As soon as you are able, call our office [phone], at any time of the day or night, and leave a message stating your name, the date and nature of the emergency, and the name and dose of the medication prescribed. In the event that your call is answered by a member of our  staff, make sure to document and save the date, time, and the name of the person that took your information.  2. Exception #2 (Planned Surgery): In the event that you are scheduled by another doctor or dentist to have any type of surgery or procedure, you are allowed (for a period no longer than 30 days), to receive additional pain medication, for the acute post-op pain. However, in this case, you are responsible for picking up a copy of our "Post-op Pain Management for Surgeons" handout, and giving it to your surgeon or dentist. This document is available at our office, and does not require an appointment to obtain it. Simply go to our office during business hours (Monday-Thursday from 8:00 AM to 4:00 PM) (Friday 8:00 AM to 12:00 Noon) or if you have a scheduled appointment with [place], prior to your surgery, and ask for it by name. In addition, you will need to provide [place] with your name, name of your surgeon, type of surgery, and date of procedure or surgery.  *Opioid medications include: morphine, codeine, oxycodone, oxymorphone, hydrocodone, hydromorphone, meperidine, tramadol, tapentadol, buprenorphine, fentanyl, methadone. **Benzodiazepine medications include: diazepam (Valium), alprazolam (Xanax), clonazepam (Klonopine), lorazepam (Ativan), clorazepate (Tranxene), chlordiazepoxide (Librium), estazolam (Prosom), oxazepam (Serax), temazepam (Restoril), triazolam (Halcion)  ____________________________________________________________________________________________ ____________________________________________________________________________________________  Preparing for Procedure with Sedation Instructions:  Oral Intake: Do not eat or drink anything for at least 8 hours prior to your procedure.  Transportation: Public transportation is not allowed. Bring an adult driver. The driver must be physically present in our waiting room before any procedure can be started.  Physical Assistance: Bring an  adult physically capable of assisting you, in the event you need help. This adult should keep you company at home for at least 6 hours after the procedure.  Blood Pressure Medicine: Take your blood pressure medicine with a sip of water the morning of the procedure.  Blood thinners:   Diabetics on insulin: Notify  the staff so that you can be scheduled 1st case in the morning. If your diabetes requires high dose insulin, take only  of your normal insulin dose the morning of the procedure and notify the staff that you have done so.  Preventing infections: Shower with an antibacterial soap the morning of your procedure.  Build-up your immune system: Take 1000 mg of Vitamin C with every meal (3 times a day) the day prior to your procedure.  Antibiotics: Inform the staff if you have a condition or reason that requires you to take antibiotics before dental procedures.  Pregnancy: If you are pregnant, call and cancel the procedure.  Sickness: If you have a cold, fever, or any active infections, call and cancel the procedure.  Arrival: You must be in the facility at least 30 minutes prior to your scheduled procedure.  Children: Do not bring children with you.  Dress appropriately: Bring dark clothing that you would not mind if they get stained.  Valuables: Do not bring any jewelry or valuables. Procedure appointments are reserved for interventional treatments only.  No Prescription Refills.  No medication changes will be discussed during procedure appointments.  No disability issues will be discussed. ____________________________________________________________________________________________  ____________________________________________________________________________________________  DRUG HOLIDAYS  Definitions Tolerance: defined as the progressively decreased responsiveness to a drug. Occurs when the drug is used repeatedly and the body adapts to the continued presence of the drug.  As a result, a larger dose of the drug is needed to achieve the effect originally obtained by a smaller dose. It is thought to be due to the formation of excess opioid receptors.  Drug Holiday: is when a patient stops taking a medication(s) for a period of time; anywhere from a few days to several weeks.  Withdrawals: refers to the wide range of symptoms that occur after stopping or dramatically reducing opiate drugs after heavy and prolonged use. Withdrawal symptoms do not occur to patients that use low dose opioids, or those who take the medication sporadically. Contrary to benzodiazepine (example: Valium, Xanax, etc.) or alcohol withdrawals (Delirium Tremens), opioid withdrawals are not lethal. Withdrawals are the physical manifestation of the body getting rid of the excess receptors.  Purpose To eliminate tolerance.  Duration of Holiday 14 consecutive days. (2 weeks)  Expected Symptoms Early symptoms of withdrawal include:  Agitation  Anxiety  Muscle aches  Increased tearing  Insomnia  Runny nose  Sweating  Yawning  Late symptoms of withdrawal include:  Abdominal cramping  Diarrhea  Dilated pupils  Goose bumps  Nausea  Vomiting  Opioid withdrawal reactions are very uncomfortable but are not life-threatening. Symptoms usually start within 12 hours of last opioid dose and within 30 hours of last methadone exposure.  Duration of Symptoms 48 to 72 hours for short acting medications and 2 to 14 days for methadone.  Treatment  Clonidine (Catapres) or tizanidine (Zanaflex) for agitation, sweating, tearing, runny nose.  Promethazine (Phenergan) for nausea, vomiting.  NSAIDs for pain.  Benefits  Improved effectiveness of opioids.  Decreased opioid dose needed to achieve benefits.  Improved pain with lesser dose.  You were given one prescription each for MS Contin and  Oxycodone ____________________________________________________________________________________________

## 2017-04-26 ENCOUNTER — Encounter: Payer: Self-pay | Admitting: Pain Medicine

## 2017-04-26 ENCOUNTER — Ambulatory Visit: Payer: BLUE CROSS/BLUE SHIELD | Attending: Pain Medicine | Admitting: Pain Medicine

## 2017-04-26 VITALS — BP 145/85 | HR 60 | Temp 98.4°F | Resp 16 | Ht 69.0 in | Wt 285.0 lb

## 2017-04-26 DIAGNOSIS — I10 Essential (primary) hypertension: Secondary | ICD-10-CM | POA: Diagnosis not present

## 2017-04-26 DIAGNOSIS — G4700 Insomnia, unspecified: Secondary | ICD-10-CM | POA: Insufficient documentation

## 2017-04-26 DIAGNOSIS — F1190 Opioid use, unspecified, uncomplicated: Secondary | ICD-10-CM

## 2017-04-26 DIAGNOSIS — F419 Anxiety disorder, unspecified: Secondary | ICD-10-CM | POA: Diagnosis not present

## 2017-04-26 DIAGNOSIS — M25511 Pain in right shoulder: Secondary | ICD-10-CM

## 2017-04-26 DIAGNOSIS — F329 Major depressive disorder, single episode, unspecified: Secondary | ICD-10-CM | POA: Diagnosis not present

## 2017-04-26 DIAGNOSIS — G894 Chronic pain syndrome: Secondary | ICD-10-CM | POA: Diagnosis present

## 2017-04-26 DIAGNOSIS — N50811 Right testicular pain: Secondary | ICD-10-CM | POA: Insufficient documentation

## 2017-04-26 DIAGNOSIS — M79642 Pain in left hand: Secondary | ICD-10-CM | POA: Insufficient documentation

## 2017-04-26 DIAGNOSIS — M542 Cervicalgia: Secondary | ICD-10-CM

## 2017-04-26 DIAGNOSIS — N50812 Left testicular pain: Secondary | ICD-10-CM | POA: Insufficient documentation

## 2017-04-26 DIAGNOSIS — G709 Myoneural disorder, unspecified: Secondary | ICD-10-CM | POA: Insufficient documentation

## 2017-04-26 DIAGNOSIS — D649 Anemia, unspecified: Secondary | ICD-10-CM | POA: Diagnosis not present

## 2017-04-26 DIAGNOSIS — Z888 Allergy status to other drugs, medicaments and biological substances status: Secondary | ICD-10-CM | POA: Diagnosis not present

## 2017-04-26 DIAGNOSIS — N529 Male erectile dysfunction, unspecified: Secondary | ICD-10-CM | POA: Insufficient documentation

## 2017-04-26 DIAGNOSIS — G8929 Other chronic pain: Secondary | ICD-10-CM | POA: Diagnosis not present

## 2017-04-26 DIAGNOSIS — Z79891 Long term (current) use of opiate analgesic: Secondary | ICD-10-CM

## 2017-04-26 DIAGNOSIS — N50819 Testicular pain, unspecified: Secondary | ICD-10-CM | POA: Diagnosis not present

## 2017-04-26 DIAGNOSIS — M79602 Pain in left arm: Secondary | ICD-10-CM | POA: Diagnosis not present

## 2017-04-26 MED ORDER — OXYCODONE HCL 15 MG PO TABS: 15.0000 mg | ORAL_TABLET | Freq: Four times a day (QID) | ORAL | 0 refills | Status: DC | PRN

## 2017-04-26 MED ORDER — MORPHINE SULFATE ER 30 MG PO TBCR: 30.0000 mg | EXTENDED_RELEASE_TABLET | Freq: Two times a day (BID) | ORAL | 0 refills | Status: DC

## 2017-04-26 NOTE — Patient Instructions (Addendum)
____________________________________________________________________________________________  Medication Rules  Applies to: All patients receiving prescriptions (written or electronic).  Pharmacy of record: Pharmacy where electronic prescriptions will be sent. If written prescriptions are taken to a different pharmacy, please inform the nursing staff. The pharmacy listed in the electronic medical record should be the one where you would like electronic prescriptions to be sent.  Prescription refills: Only during scheduled appointments. Applies to both, written and electronic prescriptions.  NOTE: The following applies primarily to controlled substances (Opioid* Pain Medications).   Patient's responsibilities: 1. Pain Pills: Bring all pain pills to every appointment (except for procedure appointments). 2. Pill Bottles: Bring pills in original pharmacy bottle. Always bring newest bottle. Bring bottle, even if empty. 3. Medication refills: You are responsible for knowing and keeping track of what medications you need refilled. The day before your appointment, write a list of all prescriptions that need to be refilled. Bring that list to your appointment and give it to the admitting nurse. Prescriptions will be written only during appointments. If you forget a medication, it will not be "Called in", "Faxed", or "electronically sent". You will need to get another appointment to get these prescribed. 4. Prescription Accuracy: You are responsible for carefully inspecting your prescriptions before leaving our office. Have the discharge nurse carefully go over each prescription with you, before taking them home. Make sure that your name is accurately spelled, that your address is correct. Check the name and dose of your medication to make sure it is accurate. Check the number of pills, and the written instructions to make sure they are clear and accurate. Make sure that you are given enough medication to  last until your next medication refill appointment. 5. Taking Medication: Take medication as prescribed. Never take more pills than instructed. Never take medication more frequently than prescribed. Taking less pills or less frequently is permitted and encouraged, when it comes to controlled substances (written prescriptions).  6. Inform other Doctors: Always inform, all of your healthcare providers, of all the medications you take. 7. Pain Medication from other Providers: You are not allowed to accept any additional pain medication from any other Doctor or Healthcare provider. There are two exceptions to this rule. (see below) In the event that you require additional pain medication, you are responsible for notifying us, as stated below. 8. Medication Agreement: You are responsible for carefully reading and following our Medication Agreement. This must be signed before receiving any prescriptions from our practice. Safely store a copy of your signed Agreement. Violations to the Agreement will result in no further prescriptions. (Additional copies of our Medication Agreement are available upon request.) 9. Laws, Rules, & Regulations: All patients are expected to follow all Federal and [employer], Rules, [employer]. Ignorance of the Laws does not constitute a valid excuse. The use of any illegal substances is prohibited. 10. Adopted CDC guidelines & recommendations: Target dosing levels will be at or below 60 MME/day. Use of benzodiazepines** is not recommended.  Exceptions: There are only two exceptions to the rule of not receiving pain medications from other Healthcare Providers. 1. Exception #1 (Emergencies): In the event of an emergency (i.e.: accident requiring emergency care), you are allowed to receive additional pain medication. However, you are responsible for: As soon as you are able, call our office [phone], at any time of the day or night, and leave a message stating your  name, the date and nature of the emergency, and the name and dose of the medication  prescribed. In the event that your call is answered by a member of our staff, make sure to document and save the date, time, and the name of the person that took your information.  2. Exception #2 (Planned Surgery): In the event that you are scheduled by another doctor or dentist to have any type of surgery or procedure, you are allowed (for a period no longer than 30 days), to receive additional pain medication, for the acute post-op pain. However, in this case, you are responsible for picking up a copy of our "Post-op Pain Management for Surgeons" handout, and giving it to your surgeon or dentist. This document is available at our office, and does not require an appointment to obtain it. Simply go to our office during business hours (Monday-Thursday from 8:00 AM to 4:00 PM) (Friday 8:00 AM to 12:00 Noon) or if you have a scheduled appointment with [place], prior to your surgery, and ask for it by name. In addition, you will need to provide [place] with your name, name of your surgeon, type of surgery, and date of procedure or surgery.  *Opioid medications include: morphine, codeine, oxycodone, oxymorphone, hydrocodone, hydromorphone, meperidine, tramadol, tapentadol, buprenorphine, fentanyl, methadone. **Benzodiazepine medications include: diazepam (Valium), alprazolam (Xanax), clonazepam (Klonopine), lorazepam (Ativan), clorazepate (Tranxene), chlordiazepoxide (Librium), estazolam (Prosom), oxazepam (Serax), temazepam (Restoril), triazolam (Halcion)  ____________________________________________________________________________________________ ____________________________________________________________________________________________  Preparing for Procedure with Sedation Instructions:  Oral Intake: Do not eat or drink anything for at least 8 hours prior to your procedure.  Transportation: Public transportation is not allowed.  Bring an adult driver. The driver must be physically present in our waiting room before any procedure can be started.  Physical Assistance: Bring an adult physically capable of assisting you, in the event you need help. This adult should keep you company at home for at least 6 hours after the procedure.  Blood Pressure Medicine: Take your blood pressure medicine with a sip of water the morning of the procedure.  Blood thinners:   Diabetics on insulin: Notify the staff so that you can be scheduled 1st case in the morning. If your diabetes requires high dose insulin, take only  of your normal insulin dose the morning of the procedure and notify the staff that you have done so.  Preventing infections: Shower with an antibacterial soap the morning of your procedure.  Build-up your immune system: Take 1000 mg of Vitamin C with every meal (3 times a day) the day prior to your procedure.  Antibiotics: Inform the staff if you have a condition or reason that requires you to take antibiotics before dental procedures.  Pregnancy: If you are pregnant, call and cancel the procedure.  Sickness: If you have a cold, fever, or any active infections, call and cancel the procedure.  Arrival: You must be in the facility at least 30 minutes prior to your scheduled procedure.  Children: Do not bring children with you.  Dress appropriately: Bring dark clothing that you would not mind if they get stained.  Valuables: Do not bring any jewelry or valuables. Procedure appointments are reserved for interventional treatments only.  No Prescription Refills.  No medication changes will be discussed during procedure appointments.  No disability issues will be discussed. ____________________________________________________________________________________________  ____________________________________________________________________________________________  DRUG HOLIDAYS  Definitions Tolerance: defined as the  progressively decreased responsiveness to a drug. Occurs when the drug is used repeatedly and the body adapts to the continued presence of the drug. As a result, a larger dose of the drug is needed to  achieve the effect originally obtained by a smaller dose. It is thought to be due to the formation of excess opioid receptors.  Drug Holiday: is when a patient stops taking a medication(s) for a period of time; anywhere from a few days to several weeks.  Withdrawals: refers to the wide range of symptoms that occur after stopping or dramatically reducing opiate drugs after heavy and prolonged use. Withdrawal symptoms do not occur to patients that use low dose opioids, or those who take the medication sporadically. Contrary to benzodiazepine (example: Valium, Xanax, etc.) or alcohol withdrawals (Delirium Tremens), opioid withdrawals are not lethal. Withdrawals are the physical manifestation of the body getting rid of the excess receptors.  Purpose To eliminate tolerance.  Duration of Holiday 14 consecutive days. (2 weeks)  Expected Symptoms Early symptoms of withdrawal include:  Agitation  Anxiety  Muscle aches  Increased tearing  Insomnia  Runny nose  Sweating  Yawning  Late symptoms of withdrawal include:  Abdominal cramping  Diarrhea  Dilated pupils  Goose bumps  Nausea  Vomiting  Opioid withdrawal reactions are very uncomfortable but are not life-threatening. Symptoms usually start within 12 hours of last opioid dose and within 30 hours of last methadone exposure.  Duration of Symptoms 48 to 72 hours for short acting medications and 2 to 14 days for methadone.  Treatment  Clonidine (Catapres) or tizanidine (Zanaflex) for agitation, sweating, tearing, runny nose.  Promethazine (Phenergan) for nausea, vomiting.  NSAIDs for pain.  Benefits  Improved effectiveness of opioids.  Decreased opioid dose needed to achieve benefits.  Improved pain with lesser  dose.  You were given one prescription each for MS Contin and Oxycodone ____________________________________________________________________________________________

## 2017-04-26 NOTE — Progress Notes (Signed)
Safety precautions to be maintained throughout the outpatient stay will include: orient to surroundings, keep bed in low position, maintain call bell within reach at all times, provide assistance with transfer out of bed and ambulation.  

## 2017-05-16 ENCOUNTER — Telehealth: Payer: Self-pay | Admitting: Pain Medicine

## 2017-05-16 NOTE — Telephone Encounter (Signed)
Pts wife called  Daine Gipynthia Frie.Marland Kitchen. Pts wife says he saw Dr Dorris CarnesN a few weeks ago and was prescribed medications. Pt wife says the pt requires prior auth. Pt has paid out of pocket for a two week supply of morphine. Pts wife would like to speak with a nurse ASAP.   [facility] [id number] Member ID [id number] Ian Morse- Policy Holder

## 2017-05-16 NOTE — Telephone Encounter (Signed)
Submitted PA request for MS Contin. Received approval. Pharmacy notified. Contacted [facility] about O[facility]one. Pharmacist Leonette MostCharles verified that only 7 days supply was approved by insurance. A new prescription will have to be written for remainder. No PA needed. WIll ask Dr. Laban EmperorNaveira about this.

## 2017-05-16 NOTE — Telephone Encounter (Signed)
Rush Farmerharles Ingram calling from [facility] [phone] for Taevyn. MSContin needs prior [facility] [phone] patient ID [id number] for prior auth. Also patient was only allowed 7 day supply of Oxycodone to begin with from [facility], he will need script for remainder of medications 92 quantity. If you have any questions call pharmacy and he will help.

## 2017-05-17 NOTE — Telephone Encounter (Signed)
Discussed with Dr. Laban EmperorNaveira. Left message for patient to call.

## 2017-05-18 NOTE — Telephone Encounter (Signed)
Dr. Laban EmperorNaveira informed of Oxycodone issue.  He will not write new prescription for additional Oxycodone that were not filled on the original prescription. Instructed to have patient call the [place] [employer] at [phone], or go to their website, click on Request Assistance to File a Complaint. Attempted to call patient to instruct him to do this, message left.

## 2017-05-19 NOTE — Telephone Encounter (Signed)
Spoke with patient's wife, instructed her to do as Dr. Laban EmperorNaveira advised. They will discuss this with Dr. Laban EmperorNaveira at next appointment on 05-24-17.

## 2017-05-24 ENCOUNTER — Encounter: Payer: Self-pay | Admitting: Pain Medicine

## 2017-05-24 ENCOUNTER — Ambulatory Visit (HOSPITAL_BASED_OUTPATIENT_CLINIC_OR_DEPARTMENT_OTHER): Payer: BLUE CROSS/BLUE SHIELD | Admitting: Pain Medicine

## 2017-05-24 ENCOUNTER — Ambulatory Visit: Payer: Medicare PPO | Admitting: Pain Medicine

## 2017-05-24 ENCOUNTER — Ambulatory Visit
Admission: RE | Admit: 2017-05-24 | Discharge: 2017-05-24 | Disposition: A | Discharge status: Home / Self Care | Payer: BLUE CROSS/BLUE SHIELD | Source: Ambulatory Visit | Attending: Pain Medicine | Admitting: Pain Medicine

## 2017-05-24 VITALS — BP 122/97 | HR 65 | Temp 98.4°F | Resp 19 | Ht 69.0 in | Wt 294.0 lb

## 2017-05-24 DIAGNOSIS — G894 Chronic pain syndrome: Secondary | ICD-10-CM

## 2017-05-24 DIAGNOSIS — M25511 Pain in right shoulder: Secondary | ICD-10-CM | POA: Diagnosis not present

## 2017-05-24 DIAGNOSIS — Z888 Allergy status to other drugs, medicaments and biological substances status: Secondary | ICD-10-CM | POA: Insufficient documentation

## 2017-05-24 DIAGNOSIS — M79602 Pain in left arm: Secondary | ICD-10-CM

## 2017-05-24 DIAGNOSIS — G8929 Other chronic pain: Secondary | ICD-10-CM

## 2017-05-24 DIAGNOSIS — M19011 Primary osteoarthritis, right shoulder: Secondary | ICD-10-CM | POA: Insufficient documentation

## 2017-05-24 MED ORDER — METHYLPREDNISOLONE ACETATE 80 MG/ML IJ SUSP
80.0000 mg | Freq: Once | INTRAMUSCULAR | Status: AC
  Administered 2017-05-24: 80 mg
  Filled 2017-05-24: qty 1

## 2017-05-24 MED ORDER — MORPHINE SULFATE ER 30 MG PO TBCR: 30.0000 mg | EXTENDED_RELEASE_TABLET | Freq: Two times a day (BID) | ORAL | 0 refills | Status: DC

## 2017-05-24 MED ORDER — ROPIVACAINE HCL 2 MG/ML IJ SOLN
9.0000 mL | Freq: Once | INTRAMUSCULAR | Status: AC
  Administered 2017-05-24: 9 mL
  Filled 2017-05-24: qty 10

## 2017-05-24 MED ORDER — OXYCODONE HCL 15 MG PO TABS: 15.0000 mg | ORAL_TABLET | Freq: Four times a day (QID) | ORAL | 0 refills | Status: DC | PRN

## 2017-05-24 MED ORDER — LIDOCAINE HCL (PF) 1.5 % IJ SOLN
20.0000 mL | Freq: Once | INTRAMUSCULAR | Status: AC
  Administered 2017-05-24: 20 mL
  Filled 2017-05-24: qty 20

## 2017-05-24 NOTE — Patient Instructions (Addendum)
____________________________________________________________________________________________You were given a script for Morphine and one for oxycodone today.    Post-Procedure instructions Instructions:  Apply ice: Fill a plastic sandwich bag with crushed ice. Cover it with a small towel and apply to injection site. Apply for 15 minutes then remove x 15 minutes. Repeat sequence on day of procedure, until you go to bed. The purpose is to minimize swelling and discomfort after procedure.  Apply heat: Apply heat to procedure site starting the day following the procedure. The purpose is to treat any soreness and discomfort from the procedure.  Food intake: Start with clear liquids (like water) and advance to regular food, as tolerated.   Physical activities: Keep activities to a minimum for the first 8 hours after the procedure.   Driving: If you have received any sedation, you are not allowed to drive for 24 hours after your procedure.  Blood thinner: Restart your blood thinner 6 hours after your procedure. (Only for those taking blood thinners)  Insulin: As soon as you can eat, you may resume your normal dosing schedule. (Only for those taking insulin)  Infection prevention: Keep procedure site clean and dry.  Post-procedure Pain Diary: Extremely important that this be done correctly and accurately. Recorded information will be used to determine the next step in treatment.  Pain evaluated is that of treated area only. Do not include pain from an untreated area.  Complete every hour, on the hour, for the initial 8 hours. Set an alarm to help you do this part accurately.  Do not go to sleep and have it completed later. It will not be accurate.  Follow-up appointment: Keep your follow-up appointment after the procedure. Usually 2 weeks for most procedures. (6 weeks in the case of radiofrequency.) Bring you pain diary.  Expect:  From numbing medicine (AKA: Local Anesthetics): Numbness or  decrease in pain.  Onset: Full effect within 15 minutes of injected.  Duration: It will depend on the type of local anesthetic used. On the average, 1 to 8 hours.   From steroids: Decrease in swelling or inflammation. Once inflammation is improved, relief of the pain will follow.  Onset of benefits: Depends on the amount of swelling present. The more swelling, the longer it will take for the benefits to be seen. In some cases, up to 10 days.  Duration: Steroids will stay in the system x 2 weeks. Duration of benefits will depend on multiple posibilities including persistent irritating factors.  From procedure: Some discomfort is to be expected once the numbing medicine wears off. This should be minimal if ice and heat are applied as instructed. Call if:  You experience numbness and weakness that gets worse with time, as opposed to wearing off.  New onset bowel or bladder incontinence. (Spinal procedures only)  Emergency Numbers:  Durning business hours (Monday - Thursday, 8:00 AM - 4:00 PM) (Friday, 9:00 AM - 12:00 Noon): [phone]  After hours: [phone] ____________________________________________________________________________________________  Post-procedure Information What to expect: Most procedures involve the use of a local anesthetic (numbing medicine), and a steroid (anti-inflammatory medicine).  The local anesthetics may cause temporary numbness and weakness of the legs or arms, depending on the location of the block. This numbness/weakness may last 4-6 hours, depending on the local anesthetic used. In rare instances, it can last up to 24 hours. While numb, you must be very careful not to injure the extremity.  After any procedure, you could expect the pain to get better within 15-20 minutes. This relief  is temporary and may last 4-6 hours. Once the local anesthetics wears off, you could experience discomfort, possibly more than usual, for up to 10 (ten) days. In  the case of radiofrequencies, it may last up to 6 weeks. Surgeries may take up to 8 weeks for the healing process. The discomfort is due to the irritation caused by needles going through skin and muscle. To minimize the discomfort, we recommend using ice the first day, and heat from then on. The ice should be applied for 15 minutes on, and 15 minutes off. Keep repeating this cycle until bedtime. Avoid applying the ice directly to the skin, to prevent frostbite. Heat should be used daily, until the pain improves (4-10 days). Be careful not to burn yourself.  Occasionally you may experience muscle spasms or cramps. These occur as a consequence of the irritation caused by the needle sticks to the muscle and the blood that will inevitably be lost into the surrounding muscle tissue. Blood tends to be very irritating to tissues, which tend to react by going into spasm. These spasms may start the same day of your procedure, but they may also take days to develop. This late onset type of spasm or cramp is usually caused by electrolyte imbalances triggered by the steroids, at the level of the kidney. Cramps and spasms tend to respond well to muscle relaxants, multivitamins (some are triggered by the procedure, but may have their origins in vitamin deficiencies), and "Gatorade", or any sports drinks that can replenish any electrolyte imbalances. (If you are a diabetic, ask your pharmacist to get you a sugar-free brand.) Warm showers or baths may also be helpful. Stretching exercises are highly recommended. General Instructions:  Be alert for signs of possible infection: redness, swelling, heat, red streaks, elevated temperature, and/or fever. These typically appear 4 to 6 days after the procedure. Immediately notify your doctor if you experience unusual bleeding, difficulty breathing, or loss of bowel or bladder control. If you experience increased pain, do not increase your pain medicine intake, unless instructed by your  pain physician. Post-Procedure Care:  Be careful in moving about. Muscle spasms in the area of the injection may occur. Applying ice or heat to the area is often helpful. The incidence of spinal headaches after epidural injections ranges between 1.4% and 6%. If you develop a headache that does not seem to respond to conservative therapy, please let your physician know. This can be treated with an epidural blood patch.   Post-procedure numbness or redness is to be expected, however it should average 4 to 6 hours. If numbness and weakness of your extremities begins to develop 4 to 6 hours after your procedure, and is felt to be progressing and worsening, immediately contact your physician.   Diet:  If you experience nausea, do not eat until this sensation goes away. If you had a "Stellate Ganglion Block" for upper extremity "Reflex Sympathetic Dystrophy", do not eat or drink until your hoarseness goes away. In any case, always start with liquids first and if you tolerate them well, then slowly progress to more solid foods. Activity:  For the first 4 to 6 hours after the procedure, use caution in moving about as you may experience numbness and/or weakness. Use caution in cooking, using household electrical appliances, and climbing steps. If you need to reach your Doctor call our office: [phone] Monday-Thursday 8:00 am - 4:00 PM    Fridays: Closed     In case of an emergency: In case  of emergency, call 911 or go to the nearest emergency room and have the physician there call us.  Interpretation of Procedure Every nerve block has two components: a diagnostic component, and a treatment component. Unrealistic expectations are the most common causes of "perceived failure".  In a perfect world, a single nerve block should be able to completely and permanently eliminate the pain. Sadly, the world is not perfect.  Most pain management nerve blocks are performed using local anesthetics and steroids.  Steroids are responsible for any long-term benefit that you may experience. Their purpose is to decrease any chronic swelling that may exist in the area. Steroids begin to work immediately after being injected. However, most patients will not experience any benefits until 5 to 10 days after the injection, when the swelling has come down to the point where they can tell a difference. Steroids will only help if there is swelling to be treated. As such, they can assist with the diagnosis. If effective, they suggest an inflammatory component to the pain, and if ineffective, they rule out inflammation as the main cause or component of the problem. If the problem is one of mechanical compression, you will get no benefit from those steroids.   In the case of local anesthetics, they have a crucial role in the diagnosis of your condition. Most will begin to work within15 to 20 minutes after injection. The duration will depend on the type used (short- vs. Long-acting). It is of outmost importance that patients keep tract of their pain, after the procedure. To assist with this matter, a "Post-procedure Pain Diary" is provided. Make sure to complete it and to bring it back to your follow-up appointment.  As long as the patient keeps accurate, detailed records of their symptoms after every procedure, and returns to have those interpreted, every procedure will provide us with invaluable information. Even a block that does not provide the patient with any relief, will always provide us with information about the mechanism and the origin of the pain. The only time a nerve block can be considered a waste of time is when patients do not keep track of the results, or do not keep their post-procedure appointment.  Reporting the results back to your physician The Pain Score  Pain is a subjective complaint. It cannot be seen, touched, or measured. We depend entirely on the patient's report of the pain in order to assess your  condition and treatment. To evaluate the pain, we use a pain scale, where "0" means "No Pain", and a "10" is "the worst possible pain that you can even imagine" (i.e. something like been eaten alive by a shark or being torn apart by a lion).   You will frequently be asked to rate your pain. Please be as accurate, remember that medical decisions will be based on your responses. Please do not rate your pain above a 10. Doing so is actually interpreted as "symptom magnification" (exaggeration), as well as lack of understanding with regards to the scale. To put this into perspective, when you tell us that your pain is at a 10 (ten), what you are saying is that there is nothing we can do to make this pain any worse. (Carefully think about that.)

## 2017-05-24 NOTE — Progress Notes (Addendum)
Patient's Name: Ian Morse  MRN: [id number]  Referring Provider: Marinell BlightMatthews, Daniel Alan, *  DOB: 07/07/1971  PCP: Marinell BlightMatthews, Daniel Alan, PA-C  DOS: 05/24/2017  Note by: Oswaldo DoneFrancisco A Madison Direnzo, MD  Service setting: Ambulatory outpatient  Specialty: Interventional Pain Management  Patient type: Established  Location: [facility] (AMB) Pain Management Facility  Visit type: Interventional Procedure   Primary Reason for Visit: Interventional Pain Management Treatment. CC: Shoulder Pain (right)  Procedure:  Anesthesia, Analgesia, Anxiolysis:  Type: Diagnostic Glonohumeral Joint (shoulder) Injection Region: Anterior Shoulder Area Level: Shoulder Laterality: Right-Sided  Type: Local Anesthesia Local Anesthetic: Lidocaine 1% Route: Infiltration ([place]/IM) IV Access: Declined Sedation: Declined  Indication(s): Analgesia          Indications: 1. Osteoarthritis of shoulder (Right)   2. Chronic shoulder pain (Right)   3. Chronic upper extremity pain (Location of Primary Source of Pain) (Left)   4. Chronic pain syndrome    Pain Score: Pre-procedure: 6 /10 Post-procedure: 0-No pain/10  Pre-op Assessment:  Previous date of service: 04/26/17 Service provided: Med Refill Ian Morse is a 46 y.o. (year old), male patient, seen today for interventional treatment. He  has a past surgical history that includes Spinal cord stimulator insertion and Surgery scrotal / testicular. Ian Morse has a current medication list which includes the following prescription(s): albuterol, amoxicillin-clavulanate, butalbital-acetaminophen-caffeine, calcium-vitamin d, duloxetine, fluticasone, gabapentin, ginkgo biloba, krill oil, lactulose, lisinopril-hydrochlorothiazide, morphine, multiple vitamins-minerals, neomycin-polymyxin-hydrocortisone, oxycodone, potassium chloride, magnesium (amino acid chelate), testosterone, trazodone, turmeric, and vitamin b-12. His primarily concern today is the Shoulder Pain (right)  Initial Vital Signs:  Blood pressure 137/70, pulse 63, temperature 98.4 F (36.9 C), resp. rate 16, height 5\' 9"  (1.753 m), weight 294 lb (133.4 kg), SpO2 100 %. BMI: Estimated body mass index is 43.42 kg/m as calculated from the following:   Height as of this encounter: 5\' 9"  (1.753 m).   Weight as of this encounter: 294 lb (133.4 kg).  Risk Assessment: Allergies: Reviewed. He is allergic to lyrica [pregabalin].  Allergy Precautions: None required Coagulopathies: Reviewed. None identified.  Blood-thinner therapy: None at this time Active Infection(s): Reviewed. None identified. Ian Morse is afebrile  Site Confirmation: Ian Morse was asked to confirm the procedure and laterality before marking the site Procedure checklist: Completed Consent: Before the procedure and under the influence of no sedative(s), amnesic(s), or anxiolytics, the patient was informed of the treatment options, risks and possible complications. To fulfill our ethical and legal obligations, as recommended by the American Medical Association's Code of Ethics, I have informed the patient of my clinical impression; the nature and purpose of the treatment or procedure; the risks, benefits, and possible complications of the intervention; the alternatives, including doing nothing; the risk(s) and benefit(s) of the alternative treatment(s) or procedure(s); and the risk(s) and benefit(s) of doing nothing. The patient was provided information about the general risks and possible complications associated with the procedure. These may include, but are not limited to: failure to achieve desired goals, infection, bleeding, organ or nerve damage, allergic reactions, paralysis, and death. In addition, the patient was informed of those risks and complications associated to the procedure, such as failure to decrease pain; infection; bleeding; organ or nerve damage with subsequent damage to sensory, motor, and/or autonomic systems, resulting in permanent pain,  numbness, and/or weakness of one or several areas of the body; allergic reactions; (i.e.: anaphylactic reaction); and/or death. Furthermore, the patient was informed of those risks and complications associated with the medications. These include, but are not limited to:  allergic reactions (i.e.: anaphylactic or anaphylactoid reaction(s)); adrenal axis suppression; blood sugar elevation that in diabetics may result in ketoacidosis or comma; water retention that in patients with history of congestive heart failure may result in shortness of breath, pulmonary edema, and decompensation with resultant heart failure; weight gain; swelling or edema; medication-induced neural toxicity; particulate matter embolism and blood vessel occlusion with resultant organ, and/or nervous system infarction; and/or aseptic necrosis of one or more joints. Finally, the patient was informed that Medicine is not an exact science; therefore, there is also the possibility of unforeseen or unpredictable risks and/or possible complications that may result in a catastrophic outcome. The patient indicated having understood very clearly. We have given the patient no guarantees and we have made no promises. Enough time was given to the patient to ask questions, all of which were answered to the patient's satisfaction. Ian Morse has indicated that he wanted to continue with the procedure. Attestation: I, the ordering provider, attest that I have discussed with the patient the benefits, risks, side-effects, alternatives, likelihood of achieving goals, and potential problems during recovery for the procedure that I have provided informed consent. Date: 05/24/2017; Time: 12:06 PM  Pre-Procedure Preparation:  Monitoring: As per clinic protocol. Respiration, ETCO2, SpO2, BP, heart rate and rhythm monitor placed and checked for adequate function Safety Precautions: Patient was assessed for positional comfort and pressure points before starting the  procedure. Time-out: I initiated and conducted the "Time-out" before starting the procedure, as per protocol. The patient was asked to participate by confirming the accuracy of the "Time Out" information. Verification of the correct person, site, and procedure were performed and confirmed by me, the nursing staff, and the patient. "Time-out" conducted as per Joint Commission's Universal Protocol (UP.01.01.01). "Time-out" Date & Time: 05/24/2017; 1401 hrs.  Description of Procedure Process:   Position: Supine Target Area: Glonohumeral Joint (shoulder) Approach: Anterior approach. Area Prepped: Entire shoulder Area Prepping solution: ChloraPrep (2% chlorhexidine gluconate and 70% isopropyl alcohol) Safety Precautions: Aspiration looking for blood return was conducted prior to all injections. At no point did we inject any substances, as a needle was being advanced. No attempts were made at seeking any paresthesias. Safe injection practices and needle disposal techniques used. Medications properly checked for expiration dates. SDV (single dose vial) medications used. Description of the Procedure: Protocol guidelines were followed. The patient was placed in position over the procedure table. The target area was identified and the area prepped in the usual manner. Skin & deeper tissues infiltrated with local anesthetic. Appropriate amount of time allowed to pass for local anesthetics to take effect. The procedure needles were then advanced to the target area. Proper needle placement secured. Negative aspiration confirmed. Solution injected in intermittent fashion, asking for systemic symptoms every 0.5cc of injectate. The needles were then removed and the area cleansed, making sure to leave some of the prepping solution back to take advantage of its long term bactericidal properties. Vitals:   05/24/17 1353 05/24/17 1358 05/24/17 1402 05/24/17 1404  BP: 100/64 (!) 112/93 120/71 (!) 122/97  Pulse:      Resp:  19 (!) 21 17 19   Temp:      SpO2: 98% 100% 100% 100%  Weight:      Height:        Start Time: 1401 hrs. End Time: 1404 hrs. Materials:  Needle(s) Type: Regular needle Gauge: 22G Length: 3.5-in Medication(s): We administered lidocaine, methylPREDNISolone acetate, and ropivacaine (PF) 2 mg/mL (0.2%). Please see chart orders for dosing  details.  Imaging Guidance (Non-Spinal):  Type of Imaging Technique: Fluoroscopy Guidance (Non-Spinal) Indication(s): Assistance in needle guidance and placement for procedures requiring needle placement in or near specific anatomical locations not easily accessible without such assistance. Exposure Time: Please see nurses notes. Contrast: None used. Fluoroscopic Guidance: I was personally present during the use of fluoroscopy. "Tunnel Vision Technique" used to obtain the best possible view of the target area. Parallax error corrected before commencing the procedure. "Direction-depth-direction" technique used to introduce the needle under continuous pulsed fluoroscopy. Once target was reached, antero-posterior, oblique, and lateral fluoroscopic projection used confirm needle placement in all planes. Images permanently stored in EMR. Interpretation: No contrast injected. I personally interpreted the imaging intraoperatively. Adequate needle placement confirmed in multiple planes. Permanent images saved into the patient's record.  Antibiotic Prophylaxis:  Indication(s): None identified Antibiotic given: None  Post-operative Assessment:  EBL: None Complications: No immediate post-treatment complications observed by team, or reported by patient. Note: The patient tolerated the entire procedure well. A repeat set of vitals were taken after the procedure and the patient was kept under observation following institutional policy, for this type of procedure. Post-procedural neurological assessment was performed, showing return to baseline, prior to discharge. The  patient was provided with post-procedure discharge instructions, including a section on how to identify potential problems. Should any problems arise concerning this procedure, the patient was given instructions to immediately contact us, at any time, without hesitation. In any case, we plan to contact the patient by telephone for a follow-up status report regarding this interventional procedure. Comments:  No additional relevant information.  Plan of Care  Disposition: Discharge home  Discharge Date & Time: 05/24/2017; 1407 hrs.  Physician-requested Follow-up:  Return for post-procedure eval by Dr. Laban Emperor in 2 weeks.  New Prescriptions   No medications on file   Future Appointments Date Time Provider Department Center  06/13/2017 2:15 PM Delano Metz, MD [facility] None    Imaging Orders     DG C-Arm 1-60 Min-No Report  Procedure Orders     SHOULDER INJECTION   Medications ordered for procedure: Meds ordered this encounter  Medications  . lidocaine 1.5 % injection 20 mL    From block tray.  . methylPREDNISolone acetate (DEPO-MEDROL) injection 80 mg  . ropivacaine (PF) 2 mg/mL (0.2%) (NAROPIN) injection 9 mL  . oxyCODONE (ROXICODONE) 15 MG immediate release tablet    Sig: Take 1 tablet (15 mg total) by mouth every 6 (six) hours as needed for pain.    Dispense:  120 tablet    Refill:  0    Fill one day early if pharmacy is closed on scheduled refill date. Do not fill until: 05/28/17 To last until: 06/27/17  . morphine (MS CONTIN) 30 MG 12 hr tablet    Sig: Take 1 tablet (30 mg total) by mouth 2 (two) times daily.    Dispense:  60 tablet    Refill:  0    Fill one day early if pharmacy is closed on scheduled refill date. Do not fill until: 05/28/17 To last until: 06/27/17   Medications administered: We administered lidocaine, methylPREDNISolone acetate, and ropivacaine (PF) 2 mg/mL (0.2%).  See the medical record for exact dosing, route, and time of  administration.  Primary Care Physician: Marinell Blight, PA-C Location: [facility] Note by: Oswaldo Done, MD Date: 05/24/2017; Time: 3:00 PM  Disclaimer:  Medicine is not an Visual merchandiser. The only guarantee in medicine is that nothing is guaranteed.  It is important to note that the decision to proceed with this intervention was based on the information collected from the patient. The Data and conclusions were drawn from the patient's questionnaire, the interview, and the physical examination. Because the information was provided in large part by the patient, it cannot be guaranteed that it has not been purposely or unconsciously manipulated. Every effort has been made to obtain as much relevant data as possible for this evaluation. It is important to note that the conclusions that lead to this procedure are derived in large part from the available data. Always take into account that the treatment will also be dependent on availability of resources and existing treatment guidelines, considered by other Pain Management Practitioners as being common knowledge and practice, at the time of the intervention. For Medico-Legal purposes, it is also important to point out that variation in procedural techniques and pharmacological choices are the acceptable norm. The indications, contraindications, technique, and results of the above procedure should only be interpreted and judged by a Board-Certified Interventional Pain Specialist with extensive familiarity and expertise in the same exact procedure and technique.

## 2017-05-25 ENCOUNTER — Telehealth: Payer: Self-pay

## 2017-05-25 ENCOUNTER — Telehealth: Payer: Self-pay | Admitting: *Deleted

## 2017-05-25 NOTE — Telephone Encounter (Signed)
Voicemail left with patient to call our office if there are questions or concerns re; procedure on yesterday.  

## 2017-05-25 NOTE — Telephone Encounter (Signed)
No answer. Message left to call if needed. 

## 2017-06-13 ENCOUNTER — Ambulatory Visit: Payer: Medicare PPO | Attending: Pain Medicine | Admitting: Pain Medicine

## 2017-06-23 ENCOUNTER — Encounter: Payer: Self-pay | Admitting: Nurse Practitioner

## 2017-06-23 ENCOUNTER — Ambulatory Visit: Payer: BLUE CROSS/BLUE SHIELD | Attending: Nurse Practitioner | Admitting: Nurse Practitioner

## 2017-06-23 VITALS — BP 167/77 | HR 66 | Temp 98.4°F | Ht 69.0 in | Wt 298.0 lb

## 2017-06-23 DIAGNOSIS — E291 Testicular hypofunction: Secondary | ICD-10-CM | POA: Insufficient documentation

## 2017-06-23 DIAGNOSIS — M25511 Pain in right shoulder: Secondary | ICD-10-CM | POA: Insufficient documentation

## 2017-06-23 DIAGNOSIS — M4696 Unspecified inflammatory spondylopathy, lumbar region: Secondary | ICD-10-CM | POA: Diagnosis not present

## 2017-06-23 DIAGNOSIS — N529 Male erectile dysfunction, unspecified: Secondary | ICD-10-CM | POA: Insufficient documentation

## 2017-06-23 DIAGNOSIS — K5900 Constipation, unspecified: Secondary | ICD-10-CM | POA: Diagnosis not present

## 2017-06-23 DIAGNOSIS — D649 Anemia, unspecified: Secondary | ICD-10-CM | POA: Diagnosis not present

## 2017-06-23 DIAGNOSIS — G894 Chronic pain syndrome: Secondary | ICD-10-CM | POA: Diagnosis not present

## 2017-06-23 DIAGNOSIS — M545 Low back pain: Secondary | ICD-10-CM

## 2017-06-23 DIAGNOSIS — M1288 Other specific arthropathies, not elsewhere classified, other specified site: Secondary | ICD-10-CM | POA: Insufficient documentation

## 2017-06-23 DIAGNOSIS — M79602 Pain in left arm: Secondary | ICD-10-CM | POA: Diagnosis not present

## 2017-06-23 DIAGNOSIS — K5903 Drug induced constipation: Secondary | ICD-10-CM | POA: Diagnosis not present

## 2017-06-23 DIAGNOSIS — F1390 Sedative, hypnotic, or anxiolytic use, unspecified, uncomplicated: Secondary | ICD-10-CM | POA: Diagnosis not present

## 2017-06-23 DIAGNOSIS — R600 Localized edema: Secondary | ICD-10-CM | POA: Insufficient documentation

## 2017-06-23 DIAGNOSIS — G8929 Other chronic pain: Secondary | ICD-10-CM | POA: Diagnosis not present

## 2017-06-23 DIAGNOSIS — N50811 Right testicular pain: Secondary | ICD-10-CM | POA: Diagnosis not present

## 2017-06-23 DIAGNOSIS — I10 Essential (primary) hypertension: Secondary | ICD-10-CM | POA: Diagnosis not present

## 2017-06-23 DIAGNOSIS — T402X5A Adverse effect of other opioids, initial encounter: Secondary | ICD-10-CM | POA: Insufficient documentation

## 2017-06-23 DIAGNOSIS — M542 Cervicalgia: Secondary | ICD-10-CM | POA: Insufficient documentation

## 2017-06-23 DIAGNOSIS — N50812 Left testicular pain: Secondary | ICD-10-CM | POA: Diagnosis not present

## 2017-06-23 DIAGNOSIS — F419 Anxiety disorder, unspecified: Secondary | ICD-10-CM | POA: Diagnosis not present

## 2017-06-23 DIAGNOSIS — G90519 Complex regional pain syndrome I of unspecified upper limb: Secondary | ICD-10-CM | POA: Diagnosis not present

## 2017-06-23 DIAGNOSIS — G4700 Insomnia, unspecified: Secondary | ICD-10-CM | POA: Diagnosis not present

## 2017-06-23 DIAGNOSIS — F329 Major depressive disorder, single episode, unspecified: Secondary | ICD-10-CM | POA: Diagnosis not present

## 2017-06-23 DIAGNOSIS — Z79891 Long term (current) use of opiate analgesic: Secondary | ICD-10-CM | POA: Diagnosis not present

## 2017-06-23 DIAGNOSIS — M47816 Spondylosis without myelopathy or radiculopathy, lumbar region: Secondary | ICD-10-CM

## 2017-06-23 MED ORDER — MORPHINE SULFATE ER 30 MG PO TBCR: 30.0000 mg | EXTENDED_RELEASE_TABLET | Freq: Two times a day (BID) | ORAL | 0 refills | Status: DC

## 2017-06-23 MED ORDER — OXYCODONE HCL 15 MG PO TABS: 15.0000 mg | ORAL_TABLET | Freq: Four times a day (QID) | ORAL | 0 refills | Status: DC | PRN

## 2017-06-23 NOTE — Progress Notes (Signed)
Nursing Pain Medication Assessment:  Safety precautions to be maintained throughout the outpatient stay will include: orient to surroundings, keep bed in low position, maintain call bell within reach at all times, provide assistance with transfer out of bed and ambulation.  Medication Inspection Compliance: Pill count conducted under aseptic conditions, in front of the patient. Neither the pills nor the bottle was removed from the patient's sight at any time. Once count was completed pills were immediately returned to the patient in their original bottle.  Medication #1: Oxycodone IR Pill/Patch Count: 37 of 120 pills remain Pill/Patch Appearance: Markings consistent with prescribed medication Bottle Appearance: Standard pharmacy container. Clearly labeled. Filled Date: 08 / 11 / 2018 Last Medication intake:  Today  Medication #2: Morphine ER (MSContin) Pill/Patch Count: 13 of 60 pills remain Pill/Patch Appearance: Markings consistent with prescribed medication Bottle Appearance: Standard pharmacy container. Clearly labeled. Filled Date: 08 / 11 / 2018 Last Medication intake:  Today

## 2017-06-23 NOTE — Progress Notes (Signed)
Patient's Name: Ian Morse  MRN: [id number]  Referring Provider: Dellia Beckwith, *  DOB: 1970-12-18  PCP: Dellia Beckwith, PA-C  DOS: 06/23/2017  Note by: Vevelyn Francois NP  Service setting: Ambulatory outpatient  Specialty: Interventional Pain Management  Location: [facility] (AMB) Pain Management Facility    Patient type: Established    Primary Reason(s) for Visit: Encounter for prescription drug management & post-procedure evaluation of chronic illness with mild to moderate exacerbation(Level of risk: moderate) CC: Arm Pain (left through to left hand) and Neck Pain (left)  HPI  Ian Morse is a 46 y.o. year old, male patient, who comes today for a post-procedure evaluation and medication management. 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); Long term (current) use of opiate analgesic (nonstop since 04/11/2011); Long term prescription opiate use; Opiate use (150 MME/Day); Chronic upper extremity pain (Location of Primary Source of Pain) (Left); Chronic neck pain (Location of Secondary source of pain) (Bilateral) (L>R); Chronic testicular pain (Location of Tertiary source of pain) (Bilateral) (L>R); Chronic low back pain (Bilateral) (L>R); Spinal cord stimulator status; Encounter for interrogation of neurostimulator; History of Chronic CRPS of upper extremity (Left); Lumbar facet syndrome (Bilateral) (L>R); Opioid-induced constipation (OIC); Long term prescription benzodiazepine use (since before 04/11/2011); Chronic shoulder pain (Right); and Osteoarthritis of shoulder (Right) on his problem list. His primarily concern today is the Arm Pain (left through to left hand) and Neck Pain  (left)  Pain Assessment: Location: Left Arm Radiating: left hand neck Onset: More than a month ago Duration: Chronic pain Quality: Aching, Dull Severity: 6 /10 (self-reported pain score)  Note: Reported level is compatible with observation.                   Effect on ADL:   Timing: Constant Modifying factors: medciations  Ian Morse was last seen on 05/24/17 for a procedure. During today's appointment we reviewed Ian Morse post-procedure results, as well as his outpatient medication regimen. He had 1000% relief with right shoulder injection. He has left hand and arm pain that radiates up into his neck. He has numbness, tingling and weakness. He has SCS which is effective. He admits to having constipation. He uses lactulose q 48 hours. He admits to occasional cramps  Further details on both, my assessment(s), as well as the proposed treatment plan, please see below.  Controlled Substance Pharmacotherapy Assessment REMS (Risk Evaluation and Mitigation Strategy)  Analgesic: morphine ER 30 mg 1 tablet by mouth twice a day (60 mg/day of morphine) + oxycodone IR 15 mg 1 tablet by mouth every 6 hours (60 mg/day of oxycodone)  MME/day: 177m/day.  SHart Rochester RN  06/23/2017  9:21 AM  Sign at close encounter Nursing Pain Medication Assessment:  Safety precautions to be maintained throughout the outpatient stay will include: orient to surroundings, keep bed in low position, maintain call bell within reach at all times, provide assistance with transfer out of bed and ambulation.  Medication Inspection Compliance: Pill count conducted under aseptic conditions, in front of the patient. Neither the pills nor the bottle was removed from the patient's sight at any time. Once count was completed pills were immediately returned to the patient in their original bottle.  Medication #1: Oxycodone IR Pill/Patch Count: 37 of 120  pills remain Pill/Patch Appearance: Markings consistent with prescribed  medication Bottle Appearance: Standard pharmacy container. Clearly labeled. Filled Date: 08 / 11 / 2018 Last Medication intake:  Today  Medication #2: Morphine ER (MSContin) Pill/Patch Count: 13 of 60 pills remain Pill/Patch Appearance: Markings consistent with prescribed medication Bottle Appearance: Standard pharmacy container. Clearly labeled. Filled Date: 08 / 11 / 2018 Last Medication intake:  Today   Pharmacokinetics: Liberation and absorption (onset of action): WNL Distribution (time to peak effect): WNL Metabolism and excretion (duration of action): WNL         Pharmacodynamics: Desired effects: Analgesia: Ian Morse reports >50% benefit. Functional ability: Patient reports that medication allows him to accomplish basic ADLs Clinically meaningful improvement in function (CMIF): Sustained CMIF goals met Perceived effectiveness: Described as relatively effective, allowing for increase in activities of daily living (ADL) Undesirable effects: Side-effects or Adverse reactions: None reported Monitoring: [place] PMP: Online review of the past 32-monthperiod conducted. Compliant with practice rules and regulations List of all UDS test(s) done:  Lab Results  Component Value Date   SUMMARY FINAL 04/05/2017   Last UDS on record: Summary  Date Value Ref Range Status  04/05/2017 FINAL  Final    Comment:    ==================================================================== TOXAS[facility]RE COMP DRUG ANALYSIS,UR ==================================================================== Test                             Result       Flag       Units Drug Present and Declared for Prescription Verification   Morphine                       >8197        EXPECTED   ng/mg creat    Potential sources of large amounts of morphine in the absence of    codeine include administration of morphine or use of heroin.   Oxycodone                      730          EXPECTED   ng/mg creat   Oxymorphone                     330          EXPECTED   ng/mg creat   Noroxycodone                   4178         EXPECTED   ng/mg creat   Noroxymorphone                 179          EXPECTED   ng/mg creat    Sources of oxycodone are scheduled prescription medications.    Oxymorphone, noroxycodone, and noroxymorphone are expected    metabolites of oxycodone. Oxymorphone is also available as a    scheduled prescription medication.   Gabapentin                     PRESENT      EXPECTED   Trazodone                      PRESENT      EXPECTED   1,3 chlorophenyl piperazine    PRESENT      EXPECTED    1,3-chlorophenyl piperazine is an  expected metabolite of    trazodone.   Acetaminophen                  PRESENT      EXPECTED Drug Present not Declared for Prescription Verification   Salicylate                     PRESENT      UNEXPECTED Drug Absent but Declared for Prescription Verification   Butalbital                     Not Detected UNEXPECTED   Duloxetine                     Not Detected UNEXPECTED   Fluoxetine                     Not Detected UNEXPECTED ==================================================================== Test                      Result    Flag   Units      Ref Range   Creatinine              122              mg/dL      >=20 ==================================================================== Declared Medications:  The flagging and interpretation on this report are based on the  following declared medications.  Unexpected results may arise from  inaccuracies in the declared medications.  **Note: The testing scope of this panel includes these medications:  Butalbital (Butalbital/APAP/Caffeine)  Duloxetine  Fluoxetine  Gabapentin  Morphine (Morphine Sulfate)  Oxycodone  Trazodone  **Note: The testing scope of this panel does not include small to  moderate amounts of these reported medications:  Acetaminophen (Butalbital/APAP/Caffeine)  **Note: The testing scope of this panel does not include  following  reported medications:  Albuterol  Caffeine (Butalbital/APAP/Caffeine)  Calcium Carbonate (Calcium Carb/Vitamin D)  Cyanocobalamin  Fluticasone  Herbal Product  Hydrochlorothiazide (Lisinopril-HCTZ)  Lactulose  Lisinopril (Lisinopril-HCTZ)  Magnesium (Mag)  Multivitamin (MVI)  Potassium  Supplement (Krill Oil)  Testosterone  Turmeric  Vitamin D (Calcium Carb/Vitamin D) ==================================================================== For clinical consultation, please call [phone]. ====================================================================    UDS interpretation: Compliant          Medication Assessment Form: Reviewed. Patient indicates being compliant with therapy Treatment compliance: Compliant Risk Assessment Profile: Aberrant behavior: See prior evaluations. None observed or detected today Comorbid factors increasing risk of overdose: See prior notes. No additional risks detected today Risk of substance use disorder (SUD): Low     Opioid Risk Tool - 04/26/17 1419      Family History of Substance Abuse   Alcohol Positive Male   Illegal Drugs Negative   Rx Drugs Negative     Personal History of Substance Abuse   Alcohol Positive Male or Male   Illegal Drugs Negative   Rx Drugs Negative     Age   Age between 72-45 years  No     Psychological Disease   Psychological Disease Positive   ADD Negative   OCD Negative   Bipolar Negative   Schizophrenia Negative   Depression Positive     Total Score   Opioid Risk Tool Scoring 9   Opioid Risk Interpretation High Risk     ORT Scoring interpretation table:  Score <3 = Low Risk for SUD  Score between 4-7 = Moderate Risk for SUD  Score >  8 = High Risk for Opioid Abuse   Risk Mitigation Strategies:  Patient Counseling: Covered Patient-Prescriber Agreement (PPA): Present and active  Notification to other healthcare providers: Done  Pharmacologic Plan: No change in therapy, at this  time  Post-Procedure Assessment  Visit date not found Procedure:05/24/2017 Pre-procedure pain score:  6/10 Post-procedure pain score: 0/10         Influential Factors: BMI: 44.01 kg/m Intra-procedural challenges: None observed.         Assessment challenges: None detected.              Reported side-effects: None.        Post-procedural adverse reactions or complications: None reported         Sedation: Please see nurses note. When no sedatives are used, the analgesic levels obtained are directly associated to the effectiveness of the local anesthetics. However, when sedation is provided, the level of analgesia obtained during the initial 1 hour following the intervention, is believed to be the result of a combination of factors. These factors may include, but are not limited to: 1. The effectiveness of the local anesthetics used. 2. The effects of the analgesic(s) and/or anxiolytic(s) used. 3. The degree of discomfort experienced by the patient at the time of the procedure. 4. The patients ability and reliability in recalling and recording the events. 5. The presence and influence of possible secondary gains and/or psychosocial factors. Reported result: Relief experienced during the 1st hour after the procedure: 100 % (Ultra-Short Term Relief)            Interpretative annotation: Clinically appropriate result. Analgesia during this period is likely to be Local Anesthetic and/or IV Sedative (Analgesic/Anxiolytic) related.          Effects of local anesthetic: The analgesic effects attained during this period are directly associated to the localized infiltration of local anesthetics and therefore cary significant diagnostic value as to the etiological location, or anatomical origin, of the pain. Expected duration of relief is directly dependent on the pharmacodynamics of the local anesthetic used. Long-acting (4-6 hours) anesthetics used.  Reported result: Relief during the next 4 to 6 hour  after the procedure: 100 % (Short-Term Relief)            Interpretative annotation: Clinically appropriate result. Analgesia during this period is likely to be Local Anesthetic-related.          Long-term benefit: Defined as the period of time past the expected duration of local anesthetics (1 hour for short-acting and 4-6 hours for long-acting). With the possible exception of prolonged sympathetic blockade from the local anesthetics, benefits during this period are typically attributed to, or associated with, other factors such as analgesic sensory neuropraxia, antiinflammatory effects, or beneficial biochemical changes provided by agents other than the local anesthetics.  Reported result: Extended relief following procedure: 100 % (Long-Term Relief)            Interpretative annotation: Clinically appropriate result. Good relief. No permanent benefit expected. Inflammation plays a part in the etiology to the pain.          Current benefits: Defined as persistent relief that continues at this point in time.   Reported results: Treated area: 100 %       Interpretative annotation: Recurrence of symptoms. No permanent benefit expected. Effective diagnostic intervention.          Interpretation: Results would suggest a successful diagnostic intervention.  Plan:  Please see "Plan of Care" for details.  Laboratory Chemistry  Inflammation Markers (CRP: Acute Phase) (ESR: Chronic Phase) Lab Results  Component Value Date   CRP 5.7 (H) 04/05/2017   ESRSEDRATE 10 04/05/2017                 Renal Function Markers Lab Results  Component Value Date   BUN 15 04/05/2017   CREATININE 0.68 (L) 04/05/2017   GFRAA 131 04/05/2017   GFRNONAA 114 04/05/2017                 Hepatic Function Markers Lab Results  Component Value Date   AST 16 04/05/2017   ALT 24 04/05/2017   ALBUMIN 4.5 04/05/2017   ALKPHOS 82 04/05/2017                 Electrolytes Lab Results  Component Value  Date   NA 144 04/05/2017   K 4.1 04/05/2017   CL 102 04/05/2017   CALCIUM 9.5 04/05/2017   MG 2.1 04/05/2017                 Neuropathy Markers Lab Results  Component Value Date   VITAMINB12 438 04/05/2017                 Bone Pathology Markers Lab Results  Component Value Date   ALKPHOS 82 04/05/2017   25OHVITD1 38 04/05/2017   25OHVITD2 8.1 04/05/2017   25OHVITD3 30 04/05/2017   CALCIUM 9.5 04/05/2017                 Coagulation Parameters Lab Results  Component Value Date   PLT 316 08/21/2014                 Cardiovascular Markers Lab Results  Component Value Date   HGB 12.4 (L) 08/21/2014   HCT 37.8 (L) 08/21/2014                 Note: Lab results reviewed.  Recent Diagnostic Imaging Review  Dg C-arm 1-60 Min-no Report  Result Date: 05/24/2017 Fluoroscopy was utilized by the requesting physician.  No radiographic interpretation.   Note: Imaging results reviewed.          Meds   Current Outpatient Prescriptions:  .  albuterol (PROAIR HFA) 108 (90 Base) MCG/ACT inhaler, Inhale 2 puffs into the lungs as needed., Disp: , Rfl:  .  amoxicillin-clavulanate (AUGMENTIN) 875-125 MG tablet, , Disp: , Rfl:  .  butalbital-acetaminophen-caffeine (FIORICET, ESGIC) 50-325-40 MG tablet, Take 1 tablet by mouth every 4 (four) hours as needed for headache., Disp: , Rfl:  .  calcium-vitamin D (OSCAL WITH D) 500-200 MG-UNIT tablet, Take 1 tablet by mouth 2 (two) times daily., Disp: , Rfl:  .  DULoxetine (CYMBALTA) 60 MG capsule, Take 60 mg by mouth 2 (two) times daily., Disp: , Rfl:  .  fluticasone (FLONASE) 50 MCG/ACT nasal spray, Place 2 sprays into the nose as needed., Disp: , Rfl:  .  gabapentin (NEURONTIN) 800 MG tablet, Take 800 mg by mouth 4 (four) times daily. , Disp: , Rfl:  .  Ginkgo Biloba (GNP GINGKO BILOBA EXTRACT PO), Take by mouth., Disp: , Rfl:  .  Krill Oil 500 MG CAPS, Take by mouth., Disp: , Rfl:  .  lactulose (CHRONULAC) 10 GM/15ML solution, Take 15-30 mLs  by mouth once., Disp: , Rfl:  .  lisinopril-hydrochlorothiazide (PRINZIDE,ZESTORETIC) 10-12.5 MG tablet, Take 1 tablet by mouth daily., Disp: , Rfl:  .  [  START ON 06/27/2017] morphine (MS CONTIN) 30 MG 12 hr tablet, Take 1 tablet (30 mg total) by mouth 2 (two) times daily., Disp: 60 tablet, Rfl: 0 .  Multiple Vitamins-Minerals (EYE VITAMINS PO), Take by mouth 2 (two) times daily., Disp: , Rfl:  .  NEOMYCIN-POLYMYXIN-HYDROCORTISONE (CORTISPORIN) 1 % SOLN OTIC solution, , Disp: , Rfl:  .  [START ON 06/27/2017] oxyCODONE (ROXICODONE) 15 MG immediate release tablet, Take 1 tablet (15 mg total) by mouth every 6 (six) hours as needed for pain., Disp: 120 tablet, Rfl: 0 .  potassium chloride (K-DUR,KLOR-CON) 10 MEQ tablet, TAKE 2 TABLETS DAILY, Disp: , Rfl:  .  Specialty Vitamins Products (MAGNESIUM, AMINO ACID CHELATE,) 133 MG tablet, Take 1 tablet by mouth 2 (two) times daily., Disp: , Rfl:  .  Testosterone (ANDROGEL PUMP) 20.25 MG/ACT (1.62%) GEL, 20.25 mg., Disp: , Rfl:  .  traZODone (DESYREL) 150 MG tablet, Take 150 mg by mouth every evening., Disp: , Rfl:  .  TURMERIC PO, Take by mouth., Disp: , Rfl:  .  vitamin B-12 (CYANOCOBALAMIN) 100 MCG tablet, Take 100 mcg by mouth daily., Disp: , Rfl:   ROS  Constitutional: Denies any fever or chills Gastrointestinal: No reported hemesis, hematochezia, vomiting, or acute GI distress Musculoskeletal: Denies any acute onset joint swelling, redness, loss of ROM, or weakness Neurological: No reported episodes of acute onset apraxia, aphasia, dysarthria, agnosia, amnesia, paralysis, loss of coordination, or loss of consciousness  Allergies  Mr. Cornia is allergic to lyrica [pregabalin].  [place]  Drug: Mr. Arreguin  reports that he does not use drugs. Alcohol:  reports that he does not drink alcohol. Tobacco:  reports that he has never smoked. His smokeless tobacco use includes Chew. Medical:  has a past medical history of Anxiety; Depression; Hypertension; MRSA  carrier; and Neuromuscular disorder ([place]). Surgical: Mr. Allende  has a past surgical history that includes Spinal cord stimulator insertion and Surgery scrotal / testicular. Family: family history is not on file.  Constitutional Exam  General appearance: Well nourished, well developed, and well hydrated. In no apparent acute distress Vitals:   06/23/17 0914  BP: (!) 167/77  Pulse: 66  Temp: 98.4 F (36.9 C)  TempSrc: Oral  SpO2: 99%  Weight: 298 lb (135.2 kg)  Height: 5' 9"  (1.753 m)   BMI Assessment: Estimated body mass index is 44.01 kg/m as calculated from the following:   Height as of this encounter: 5' 9"  (1.753 m).   Weight as of this encounter: 298 lb (135.2 kg). Psych/Mental status: Alert, oriented x 3 (person, place, & time)       Eyes: PERLA Respiratory: No evidence of acute respiratory distress  Cervical Spine Area Exam  Skin & Axial Inspection: No masses, redness, edema, swelling, or associated skin lesions Alignment: Symmetrical Functional ROM: Unrestricted ROM      Stability: No instability detected Muscle Tone/Strength: Functionally intact. No obvious neuro-muscular anomalies detected. Sensory (Neurological): Unimpaired Palpation: No palpable anomalies              Upper Extremity (UE) Exam    Side: Right upper extremity  Side: Left upper extremity  Skin & Extremity Inspection: Skin color, temperature, and hair growth are WNL. No peripheral edema or cyanosis. No masses, redness, swelling, asymmetry, or associated skin lesions. No contractures.  Skin & Extremity Inspection: Skin color, temperature, and hair growth are WNL. No peripheral edema or cyanosis. No masses, redness, swelling, asymmetry, or associated skin lesions. No contractures.  Functional ROM: Unrestricted ROM  Functional ROM: Unrestricted ROM          Muscle Tone/Strength: Functionally intact. No obvious neuro-muscular anomalies detected.  Muscle Tone/Strength: Functionally intact. No obvious  neuro-muscular anomalies detected.  Sensory (Neurological): Unimpaired          Sensory (Neurological): Unimpaired          Palpation: No palpable anomalies              Palpation: No palpable anomalies              Specialized Test(s): Deferred         Specialized Test(s): Deferred          Thoracic Spine Area Exam  Skin & Axial Inspection: No masses, redness, or swelling Alignment: Symmetrical Functional ROM: Unrestricted ROM Stability: No instability detected Muscle Tone/Strength: Functionally intact. No obvious neuro-muscular anomalies detected. Sensory (Neurological): Unimpaired Muscle strength & Tone: No palpable anomalies  Lumbar Spine Area Exam  Skin & Axial Inspection: No masses, redness, or swelling Alignment: Symmetrical Functional ROM: Unrestricted ROM      Stability: No instability detected Muscle Tone/Strength: Functionally intact. No obvious neuro-muscular anomalies detected. Sensory (Neurological): Unimpaired Palpation: No palpable anomalies       Provocative Tests: Lumbar Hyperextension and rotation test: evaluation deferred today       Lumbar Lateral bending test: evaluation deferred today       Patrick's Maneuver: evaluation deferred today                    Gait & Posture Assessment  Ambulation: Unassisted Gait: Relatively normal for age and body habitus Posture: WNL   Lower Extremity Exam    Side: Right lower extremity  Side: Left lower extremity  Skin & Extremity Inspection: Skin color, temperature, and hair growth are WNL. No peripheral edema or cyanosis. No masses, redness, swelling, asymmetry, or associated skin lesions. No contractures.  Skin & Extremity Inspection: Skin color, temperature, and hair growth are WNL. No peripheral edema or cyanosis. No masses, redness, swelling, asymmetry, or associated skin lesions. No contractures.  Functional ROM: Unrestricted ROM          Functional ROM: Unrestricted ROM          Muscle Tone/Strength: Functionally  intact. No obvious neuro-muscular anomalies detected.  Muscle Tone/Strength: Functionally intact. No obvious neuro-muscular anomalies detected.  Sensory (Neurological): Unimpaired  Sensory (Neurological): Unimpaired  Palpation: No palpable anomalies  Palpation: No palpable anomalies   Assessment  Primary Diagnosis & Pertinent Problem List: The primary encounter diagnosis was Chronic upper extremity pain (Location of Primary Source of Pain) (Left). Diagnoses of Chronic neck pain (Location of Secondary source of pain) (Bilateral) (L>R), Lumbar facet syndrome (Bilateral) (L>R), Chronic low back pain (Bilateral) (L>R), Chronic pain syndrome, and Opioid-induced constipation (OIC) were also pertinent to this visit.  Status Diagnosis  Controlled Controlled Controlled 1. Chronic upper extremity pain (Location of Primary Source of Pain) (Left)   2. Chronic neck pain (Location of Secondary source of pain) (Bilateral) (L>R)   3. Lumbar facet syndrome (Bilateral) (L>R)   4. Chronic low back pain (Bilateral) (L>R)   5. Chronic pain syndrome   6. Opioid-induced constipation (OIC)     Problems updated and reviewed during this visit: Problem  Osteoarthritis of shoulder (Right)  Chronic shoulder pain (Right)  Lumbar facet syndrome (Bilateral) (L>R)  Chronic Pain Syndrome  History of Complex regional pain syndrome type I of upper extremity (Left)  Chronic upper extremity pain (Location of Primary Source of  Pain) (Left)  Chronic neck pain (Location of Secondary source of pain) (Bilateral) (L>R)  Chronic testicular pain (Location of Tertiary source of pain) (Bilateral) (L>R)  Chronic low back pain (Bilateral) (L>R)  Spinal Cord Stimulator Status  History of Chronic CRPS of upper extremity (Left)  Opioid-induced constipation (OIC)  Long term prescription benzodiazepine use (since before 04/11/2011)  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 No evidence of a"Drug Holiday". Regular monthly use documented. By 04/11/2011 the patient was already on 105 MME/Day.   Long Term Prescription Opiate Use  Opiate use (150 MME/Day)   Long-standing history of opioid use documented on the PMP dating back to 04/11/2011 at which time the patient was already on 105 MME/Day (Avinza 60 mg by mouth daily + morphine IR 15 mg 1 tablet by mouth 3 times a day). Highest recorded dosing at 210 MME/Day.   Encounter for Interrogation of Neurostimulator  Anemia  Insomnia  Localized Edema  Major Depressive Disorder, Single Episode  Other Long Term (Current) Drug Therapy  Anxiety  Benign Essential Hypertension  Other Male Erectile Dysfunction   Plan of Care  Pharmacotherapy (Medications Ordered): Meds ordered this encounter  Medications  . morphine (MS CONTIN) 30 MG 12 hr tablet    Sig: Take 1 tablet (30 mg total) by mouth 2 (two) times daily.    Dispense:  60 tablet    Refill:  0    Fill one day early if pharmacy is closed on scheduled refill date. Do not fill until:06/27/2017 To last until:07/27/2017  . oxyCODONE (ROXICODONE) 15 MG immediate release tablet    Sig: Take 1 tablet (15 mg total) by mouth every 6 (six) hours as needed for pain.    Dispense:  120 tablet    Refill:  0    Fill one day early if pharmacy is closed on scheduled refill date. Do not fill until: 06/27/2017 To last until: 07/27/2017   New Prescriptions   No medications on file   Medications administered today: Mr. Mcmains had no medications administered during this visit. Lab-work, procedure(s), and/or referral(s): No orders of the defined types were placed in this encounter.  Imaging and/or referral(s): None  Interventional management options: Planned,  scheduled, and/or pending:    On the patient's next medication management visit, we will be switching him to oxycodone and start a tapering in order to accomplish a "drug holiday".    Considering:   Diagnostic right intra-articular shoulder joint injection  Diagnostic right suprascapular nerve block   Possible right suprascapular nerve RFA   Diagnostic left cervical epidural steroid injection Diagnostic bilateral cervical facet block Possible bilateral cervical facet RFA Diagnostic bilateral lumbar facet block Possible bilateral lumbar facet RFA   PRN Procedures:   To be determined at a later time   Provider-requested follow-up: Return in about 4 weeks (around 07/21/2017) for MedMgmt.  Future Appointments Date Time Provider [place]  07/20/2017 10:30 AM Milinda Pointer, [place] None   Primary Care Physician: Dellia Beckwith, PA-C Location: [facility] Note by: Vevelyn Francois NP Date: 06/23/2017; Time: 1:03 PM  Pain Score Disclaimer: We use the NRS-11 scale. This is a self-reported, subjective measurement of pain severity with only modest accuracy. It is used primarily to identify changes within a particular  patient. It must be understood that outpatient pain scales are significantly less accurate that those used for research, where they can be applied under ideal controlled circumstances with minimal exposure to variables. In reality, the score is likely to be a combination of pain intensity and pain affect, where pain affect describes the degree of emotional arousal or changes in action readiness caused by the sensory experience of pain. Factors such as social and work situation, setting, emotional state, anxiety levels, expectation, and prior pain experience may influence pain perception and show large inter-individual differences that may also be affected by time variables.  Patient instructions provided during this  appointment: Patient Instructions    ____________________________________________________________________________________________  Medication Rules  Applies to: All patients receiving prescriptions (written or electronic).  Pharmacy of record: Pharmacy where electronic prescriptions will be sent. If written prescriptions are taken to a different pharmacy, please inform the nursing staff. The pharmacy listed in the electronic medical record should be the one where you would like electronic prescriptions to be sent.  Prescription refills: Only during scheduled appointments. Applies to both, written and electronic prescriptions.  NOTE: The following applies primarily to controlled substances (Opioid* Pain Medications).   Patient's responsibilities: 1. Pain Pills: Bring all pain pills to every appointment (except for procedure appointments). 2. Pill Bottles: Bring pills in original pharmacy bottle. Always bring newest bottle. Bring bottle, even if empty. 3. Medication refills: You are responsible for knowing and keeping track of what medications you need refilled. The day before your appointment, write a list of all prescriptions that need to be refilled. Bring that list to your appointment and give it to the admitting nurse. Prescriptions will be written only during appointments. If you forget a medication, it will not be "Called in", "Faxed", or "electronically sent". You will need to get another appointment to get these prescribed. 4. Prescription Accuracy: You are responsible for carefully inspecting your prescriptions before leaving our office. Have the discharge nurse carefully go over each prescription with you, before taking them home. Make sure that your name is accurately spelled, that your address is correct. Check the name and dose of your medication to make sure it is accurate. Check the number of pills, and the written instructions to make sure they are clear and accurate. Make sure  that you are given enough medication to last until your next medication refill appointment. 5. Taking Medication: Take medication as prescribed. Never take more pills than instructed. Never take medication more frequently than prescribed. Taking less pills or less frequently is permitted and encouraged, when it comes to controlled substances (written prescriptions).  6. Inform other Doctors: Always inform, all of your healthcare providers, of all the medications you take. 7. Pain Medication from other Providers: You are not allowed to accept any additional pain medication from any other Doctor or Healthcare provider. There are two exceptions to this rule. (see below) In the event that you require additional pain medication, you are responsible for notifying us, as stated below. 8. Medication Agreement: You are responsible for carefully reading and following our Medication Agreement. This must be signed before receiving any prescriptions from our practice. Safely store a copy of your signed Agreement. Violations to the Agreement will result in no further prescriptions. (Additional copies of our Medication Agreement are available upon request.) 9. Laws, Rules, & Regulations: All patients are expected to follow all Federal and [employer], Rules, [employer]. Ignorance of the Laws does not constitute a valid excuse. The use of  any illegal substances is prohibited. 10. Adopted CDC guidelines & recommendations: Target dosing levels will be at or below 60 MME/day. Use of benzodiazepines** is not recommended.  Exceptions: There are only two exceptions to the rule of not receiving pain medications from other Healthcare Providers. 1. Exception #1 (Emergencies): In the event of an emergency (i.e.: accident requiring emergency care), you are allowed to receive additional pain medication. However, you are responsible for: As soon as you are able, call our office [phone], at any time of the day or  night, and leave a message stating your name, the date and nature of the emergency, and the name and dose of the medication prescribed. In the event that your call is answered by a member of our staff, make sure to document and save the date, time, and the name of the person that took your information.  2. Exception #2 (Planned Surgery): In the event that you are scheduled by another doctor or dentist to have any type of surgery or procedure, you are allowed (for a period no longer than 30 days), to receive additional pain medication, for the acute post-op pain. However, in this case, you are responsible for picking up a copy of our "Post-op Pain Management for Surgeons" handout, and giving it to your surgeon or dentist. This document is available at our office, and does not require an appointment to obtain it. Simply go to our office during business hours (Monday-Thursday from 8:00 AM to 4:00 PM) (Friday 8:00 AM to 12:00 Noon) or if you have a scheduled appointment with [place], prior to your surgery, and ask for it by name. In addition, you will need to provide [place] with your name, name of your surgeon, type of surgery, and date of procedure or surgery.  *Opioid medications include: morphine, codeine, oxycodone, oxymorphone, hydrocodone, hydromorphone, meperidine, tramadol, tapentadol, buprenorphine, fentanyl, methadone. **Benzodiazepine medications include: diazepam (Valium), alprazolam (Xanax), clonazepam (Klonopine), lorazepam (Ativan), clorazepate (Tranxene), chlordiazepoxide (Librium), estazolam (Prosom), oxazepam (Serax), temazepam (Restoril), triazolam (Halcion)  ____________________________________________________________________________________________  BMI interpretation table: BMI level Category Range association with higher incidence of chronic pain  <18 kg/m2 Underweight   18.5-24.9 kg/m2 Ideal body weight   25-29.9 kg/m2 Overweight Increased incidence by 20%  30-34.9 kg/m2 Obese (Class I)  Increased incidence by 68%  35-39.9 kg/m2 Severe obesity (Class II) Increased incidence by 136%  >40 kg/m2 Extreme obesity (Class III) Increased incidence by 254%   BMI Readings from Last 4 Encounters:  06/23/17 44.01 kg/m  05/24/17 43.42 kg/m  04/26/17 42.09 kg/m  04/05/17 43.46 kg/m   Wt Readings from Last 4 Encounters:  06/23/17 298 lb (135.2 kg)  05/24/17 294 lb (133.4 kg)  04/26/17 285 lb (129.3 kg)  04/05/17 294 lb 4.8 oz (133.5 kg)

## 2017-06-23 NOTE — Patient Instructions (Addendum)
____________________________________________________________________________________________  Medication Rules  Applies to: All patients receiving prescriptions (written or electronic).  Pharmacy of record: Pharmacy where electronic prescriptions will be sent. If written prescriptions are taken to a different pharmacy, please inform the nursing staff. The pharmacy listed in the electronic medical record should be the one where you would like electronic prescriptions to be sent.  Prescription refills: Only during scheduled appointments. Applies to both, written and electronic prescriptions.  NOTE: The following applies primarily to controlled substances (Opioid* Pain Medications).   Patient's responsibilities: 1. Pain Pills: Bring all pain pills to every appointment (except for procedure appointments). 2. Pill Bottles: Bring pills in original pharmacy bottle. Always bring newest bottle. Bring bottle, even if empty. 3. Medication refills: You are responsible for knowing and keeping track of what medications you need refilled. The day before your appointment, write a list of all prescriptions that need to be refilled. Bring that list to your appointment and give it to the admitting nurse. Prescriptions will be written only during appointments. If you forget a medication, it will not be "Called in", "Faxed", or "electronically sent". You will need to get another appointment to get these prescribed. 4. Prescription Accuracy: You are responsible for carefully inspecting your prescriptions before leaving our office. Have the discharge nurse carefully go over each prescription with you, before taking them home. Make sure that your name is accurately spelled, that your address is correct. Check the name and dose of your medication to make sure it is accurate. Check the number of pills, and the written instructions to make sure they are clear and accurate. Make sure that you are given enough medication to  last until your next medication refill appointment. 5. Taking Medication: Take medication as prescribed. Never take more pills than instructed. Never take medication more frequently than prescribed. Taking less pills or less frequently is permitted and encouraged, when it comes to controlled substances (written prescriptions).  6. Inform other Doctors: Always inform, all of your healthcare providers, of all the medications you take. 7. Pain Medication from other Providers: You are not allowed to accept any additional pain medication from any other Doctor or Healthcare provider. There are two exceptions to this rule. (see below) In the event that you require additional pain medication, you are responsible for notifying us, as stated below. 8. Medication Agreement: You are responsible for carefully reading and following our Medication Agreement. This must be signed before receiving any prescriptions from our practice. Safely store a copy of your signed Agreement. Violations to the Agreement will result in no further prescriptions. (Additional copies of our Medication Agreement are available upon request.) 9. Laws, Rules, & Regulations: All patients are expected to follow all Federal and State Laws, Statutes, Rules, & Regulations. Ignorance of the Laws does not constitute a valid excuse. The use of any illegal substances is prohibited. 10. Adopted CDC guidelines & recommendations: Target dosing levels will be at or below 60 MME/day. Use of benzodiazepines** is not recommended.  Exceptions: There are only two exceptions to the rule of not receiving pain medications from other Healthcare Providers. 1. Exception #1 (Emergencies): In the event of an emergency (i.e.: accident requiring emergency care), you are allowed to receive additional pain medication. However, you are responsible for: As soon as you are able, call our office [phone], at any time of the day or night, and leave a message stating your  name, the date and nature of the emergency, and the name and dose of the medication   prescribed. In the event that your call is answered by a member of our staff, make sure to document and save the date, time, and the name of the person that took your information.  2. Exception #2 (Planned Surgery): In the event that you are scheduled by another doctor or dentist to have any type of surgery or procedure, you are allowed (for a period no longer than 30 days), to receive additional pain medication, for the acute post-op pain. However, in this case, you are responsible for picking up a copy of our "Post-op Pain Management for Surgeons" handout, and giving it to your surgeon or dentist. This document is available at our office, and does not require an appointment to obtain it. Simply go to our office during business hours (Monday-Thursday from 8:00 AM to 4:00 PM) (Friday 8:00 AM to 12:00 Noon) or if you have a scheduled appointment with us, prior to your surgery, and ask for it by name. In addition, you will need to provide us with your name, name of your surgeon, type of surgery, and date of procedure or surgery.  *Opioid medications include: morphine, codeine, oxycodone, oxymorphone, hydrocodone, hydromorphone, meperidine, tramadol, tapentadol, buprenorphine, fentanyl, methadone. **Benzodiazepine medications include: diazepam (Valium), alprazolam (Xanax), clonazepam (Klonopine), lorazepam (Ativan), clorazepate (Tranxene), chlordiazepoxide (Librium), estazolam (Prosom), oxazepam (Serax), temazepam (Restoril), triazolam (Halcion)  ____________________________________________________________________________________________  BMI interpretation table: BMI level Category Range association with higher incidence of chronic pain  <18 kg/m2 Underweight   18.5-24.9 kg/m2 Ideal body weight   25-29.9 kg/m2 Overweight Increased incidence by 20%  30-34.9 kg/m2 Obese (Class I) Increased incidence by 68%  35-39.9 kg/m2  Severe obesity (Class II) Increased incidence by 136%  >40 kg/m2 Extreme obesity (Class III) Increased incidence by 254%   BMI Readings from Last 4 Encounters:  06/23/17 44.01 kg/m  05/24/17 43.42 kg/m  04/26/17 42.09 kg/m  04/05/17 43.46 kg/m   Wt Readings from Last 4 Encounters:  06/23/17 298 lb (135.2 kg)  05/24/17 294 lb (133.4 kg)  04/26/17 285 lb (129.3 kg)  04/05/17 294 lb 4.8 oz (133.5 kg)

## 2017-06-28 NOTE — Progress Notes (Signed)
Results were reviewed and found to be: mildly abnormal  No acute injury or pathology identified  Review would suggest interventional pain management techniques may be of benefit 

## 2017-07-19 NOTE — Progress Notes (Deleted)
Patient's Name: Ian BORUNDA  MRN: [id number]  Referring Provider: Dellia Beckwith, *  DOB: 1971-08-06  PCP: Dellia Beckwith, PA-C  DOS: 07/20/2017  Note by: Gaspar Cola, MD  Service setting: Ambulatory outpatient  Specialty: Interventional Pain Management  Location: [facility] (AMB) Pain Management Facility    Patient type: Established   Primary Reason(s) for Visit: Encounter for prescription drug management. (Level of risk: moderate)  CC: No chief complaint on file.  HPI  Mr. Wavra is a 46 y.o. year old, male patient, who comes today for a medication management evaluation. 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); Long term (current) use of opiate analgesic (nonstop since 04/11/2011); Long term prescription opiate use; Opiate use (150 MME/Day); Chronic upper extremity pain (Primary Source of Pain) (Left); Chronic neck pain (Secondary source of pain) (Bilateral) (L>R); Chronic testicular pain [facility] source of pain) (Bilateral) (L>R); Chronic low back pain (Bilateral) (L>R); Spinal cord stimulator status; Encounter for interrogation of neurostimulator; History of Chronic CRPS of upper extremity (Left); Lumbar facet syndrome (Bilateral) (L>R); Opioid-induced constipation (OIC); Long term prescription benzodiazepine use (since before 04/11/2011); Chronic shoulder pain (Right); and Osteoarthritis of shoulder (Right) on his problem list. His primarily concern today is the No chief complaint on file.  Pain Assessment: Location:     Radiating:   Onset:   Duration:   Quality:   Severity:  /10 (self-reported pain score)  Note: Reported level is compatible with observation.                    When using our objective Pain Scale, levels between 6 and 10/10 are said to belong in an emergency room, as it progressively worsens from a 6/10, described as severely limiting, requiring emergency care not usually available at an outpatient pain management facility. At a 6/10 level, communication becomes difficult and requires great effort. Assistance to reach the emergency department may be required. Facial flushing and profuse sweating along with potentially dangerous increases in heart rate and blood pressure will be evident. Effect on ADL:   Timing:   Modifying factors:    Mr. Quast was last scheduled for an appointment on 06/13/2017 for medication management. During today's appointment we reviewed Mr. Mounts chronic pain status, as well as his outpatient medication regimen. The patient returns to the clinics today to start tapering his opioids down to more reasonable level. This will be done slowly and over time so as to avoid withdrawals as well as drastic changes in his symptoms.  The patient  reports that he does not use drugs. His body mass index is unknown because there is no height or weight on file.  Further details on both, my assessment(s), as well as the proposed treatment plan, please see below.  Controlled Substance Pharmacotherapy Assessment REMS (Risk Evaluation and Mitigation Strategy)  Analgesic: morphine ER 30 mg 1 tablet by mouth twice a day (60 mg/day of morphine) + oxycodone IR 15 mg 1 tablet by mouth every 6 hours (60 mg/day of oxycodone)  MME/day: 138m/day.  No notes on file Pharmacokinetics: Liberation and absorption (onset of action): WNL Distribution (time to peak effect): WNL Metabolism and excretion (duration of action): WNL         Pharmacodynamics: Desired effects: Analgesia: Mr. DUmarreports >  50% benefit. Functional ability: Patient reports that medication allows him to accomplish basic ADLs Clinically meaningful improvement in function  (CMIF): Sustained CMIF goals met Perceived effectiveness: Described as relatively effective, allowing for increase in activities of daily living (ADL) Undesirable effects: Side-effects or Adverse reactions: None reported Monitoring: [place] PMP: Online review of the past 37-monthperiod conducted. Compliant with practice rules and regulations Last UDS on record: Summary  Date Value Ref Range Status  04/05/2017 FINAL  Final    Comment:    ==================================================================== TOXASSURE COMP DRUG ANALYSIS,UR ==================================================================== Test                             Result       Flag       Units Drug Present and Declared for Prescription Verification   Morphine                       >8197        EXPECTED   ng/mg creat    Potential sources of large amounts of morphine in the absence of    codeine include administration of morphine or use of heroin.   Oxycodone                      730          EXPECTED   ng/mg creat   Oxymorphone                    330          EXPECTED   ng/mg creat   Noroxycodone                   4178         EXPECTED   ng/mg creat   Noroxymorphone                 179          EXPECTED   ng/mg creat    Sources of oxycodone are scheduled prescription medications.    Oxymorphone, noroxycodone, and noroxymorphone are expected    metabolites of oxycodone. Oxymorphone is also available as a    scheduled prescription medication.   Gabapentin                     PRESENT      EXPECTED   Trazodone                      PRESENT      EXPECTED   1,3 chlorophenyl piperazine    PRESENT      EXPECTED    1,3-chlorophenyl piperazine is an expected metabolite of    trazodone.   Acetaminophen                  PRESENT      EXPECTED Drug Present not Declared for Prescription Verification   Salicylate                     PRESENT      UNEXPECTED Drug Absent but Declared for Prescription Verification   Butalbital                      Not Detected UNEXPECTED   Duloxetine  Not Detected UNEXPECTED   Fluoxetine                     Not Detected UNEXPECTED ==================================================================== Test                      Result    Flag   Units      Ref Range   Creatinine              122              mg/dL      >=20 ==================================================================== Declared Medications:  The flagging and interpretation on this report are based on the  following declared medications.  Unexpected results may arise from  inaccuracies in the declared medications.  **Note: The testing scope of this panel includes these medications:  Butalbital (Butalbital/APAP/Caffeine)  Duloxetine  Fluoxetine  Gabapentin  Morphine (Morphine Sulfate)  Oxycodone  Trazodone  **Note: The testing scope of this panel does not include small to  moderate amounts of these reported medications:  Acetaminophen (Butalbital/APAP/Caffeine)  **Note: The testing scope of this panel does not include following  reported medications:  Albuterol  Caffeine (Butalbital/APAP/Caffeine)  Calcium Carbonate (Calcium Carb/Vitamin D)  Cyanocobalamin  Fluticasone  Herbal Product  Hydrochlorothiazide (Lisinopril-HCTZ)  Lactulose  Lisinopril (Lisinopril-HCTZ)  Magnesium (Mag)  Multivitamin (MVI)  Potassium  Supplement (Krill Oil)  Testosterone  Turmeric  Vitamin D (Calcium Carb/Vitamin D) ==================================================================== For clinical consultation, please call [phone]. ====================================================================    UDS interpretation: Compliant          Medication Assessment Form: Reviewed. Patient indicates being compliant with therapy Treatment compliance: Compliant Risk Assessment Profile: Aberrant behavior: See prior evaluations. None observed or detected today Comorbid factors increasing risk of overdose:  See prior notes. No additional risks detected today Risk of substance use disorder (SUD): Low  ORT Scoring interpretation table:  Score <3 = Low Risk for SUD  Score between 4-7 = Moderate Risk for SUD  Score >8 = High Risk for Opioid Abuse   Risk Mitigation Strategies:  Patient Counseling: Covered Patient-Prescriber Agreement (PPA): Present and active  Notification to other healthcare providers: Done  Pharmacologic Plan: No change in therapy, at this time  Laboratory Chemistry  Inflammation Markers (CRP: Acute Phase) (ESR: Chronic Phase) Lab Results  Component Value Date   CRP 5.7 (H) 04/05/2017   ESRSEDRATE 10 04/05/2017                 Renal Function Markers Lab Results  Component Value Date   BUN 15 04/05/2017   CREATININE 0.68 (L) 04/05/2017   GFRAA 131 04/05/2017   GFRNONAA 114 04/05/2017                 Hepatic Function Markers Lab Results  Component Value Date   AST 16 04/05/2017   ALT 24 04/05/2017   ALBUMIN 4.5 04/05/2017   ALKPHOS 82 04/05/2017                 Electrolytes Lab Results  Component Value Date   NA 144 04/05/2017   K 4.1 04/05/2017   CL 102 04/05/2017   CALCIUM 9.5 04/05/2017   MG 2.1 04/05/2017                 Neuropathy Markers Lab Results  Component Value Date   VITAMINB12 438 04/05/2017                 Bone Pathology Markers  Lab Results  Component Value Date   ALKPHOS 82 04/05/2017   25OHVITD1 38 04/05/2017   25OHVITD2 8.1 04/05/2017   25OHVITD3 30 04/05/2017   CALCIUM 9.5 04/05/2017                 Coagulation Parameters Lab Results  Component Value Date   PLT 316 08/21/2014                 Cardiovascular Markers Lab Results  Component Value Date   HGB 12.4 (L) 08/21/2014   HCT 37.8 (L) 08/21/2014                 Note: Lab results reviewed.  Recent Diagnostic Imaging Results  DG Lumbar Spine Complete W/Bend CLINICAL DATA:  Low back pain  EXAM: LUMBAR SPINE - COMPLETE WITH BENDING VIEWS  COMPARISON:   CT 05/18/2006  FINDINGS: Lumbosacral transitional vertebra with S1-S2 disc demonstrating grade 1 anterolisthesis of up to 6 mm involving S1 on S2 on the neutral and flexion lateral projections which reduces upon extension. No spondylolysis is identified. Findings are likely on the basis of degenerative facet arthropathy which are noted at L4-5 and L5-S1. A neurostimulator generator projects over the posterior elements at L4 and L5. No acute fracture nor suspicious osseous lesions.  IMPRESSION: 1. Lumbosacral transitional vertebra with S1-S2 disc. 2. Grade 1 anterolisthesis of S1 on S2 in the neutral and flexion lateral views which appears to reduce upon extension.  Electronically Signed   By: Ashley Royalty M.D.   On: 04/20/2017 13:55 DG Cervical Spine Complete CLINICAL DATA:  Neck pain  EXAM: CERVICAL SPINE - COMPLETE 4+ VIEW  COMPARISON:  None.  FINDINGS: There is no evidence of cervical spine fracture or prevertebral soft tissue swelling. The atlantodental interval is within normal limits. No splaying of the lateral masses of C1 on C2. The odontoid process appears intact. Neural stimulator lead projects up to the C4-5 disc level within the central canal posteriorly. Normal cervical lordosis. Slight disc space narrowing anteriorly at C6-7. Facet joints are maintained. No other significant bone abnormalities are identified.  IMPRESSION: 1. Slight disc space narrowing at C6-7. 2. Neural stimulator lead projects up to the C4-5 disc level. 3. No acute osseous abnormality.  Electronically Signed   By: Ashley Royalty M.D.   On: 04/20/2017 13:46  Note: Imaging results reviewed.        Meds   Current Outpatient Prescriptions:  .  albuterol (PROAIR HFA) 108 (90 Base) MCG/ACT inhaler, Inhale 2 puffs into the lungs as needed., Disp: , Rfl:  .  amoxicillin-clavulanate (AUGMENTIN) 875-125 MG tablet, , Disp: , Rfl:  .  butalbital-acetaminophen-caffeine (FIORICET, ESGIC) 50-325-40  MG tablet, Take 1 tablet by mouth every 4 (four) hours as needed for headache., Disp: , Rfl:  .  calcium-vitamin D (OSCAL WITH D) 500-200 MG-UNIT tablet, Take 1 tablet by mouth 2 (two) times daily., Disp: , Rfl:  .  DULoxetine (CYMBALTA) 60 MG capsule, Take 60 mg by mouth 2 (two) times daily., Disp: , Rfl:  .  fluticasone (FLONASE) 50 MCG/ACT nasal spray, Place 2 sprays into the nose as needed., Disp: , Rfl:  .  gabapentin (NEURONTIN) 800 MG tablet, Take 800 mg by mouth 4 (four) times daily. , Disp: , Rfl:  .  Ginkgo Biloba (GNP GINGKO BILOBA EXTRACT PO), Take by mouth., Disp: , Rfl:  .  Krill Oil 500 MG CAPS, Take by mouth., Disp: , Rfl:  .  lactulose (CHRONULAC) 10 GM/15ML solution, Take  15-30 mLs by mouth once., Disp: , Rfl:  .  lisinopril-hydrochlorothiazide (PRINZIDE,ZESTORETIC) 10-12.5 MG tablet, Take 1 tablet by mouth daily., Disp: , Rfl:  .  morphine (MS CONTIN) 30 MG 12 hr tablet, Take 1 tablet (30 mg total) by mouth 2 (two) times daily., Disp: 60 tablet, Rfl: 0 .  Multiple Vitamins-Minerals (EYE VITAMINS PO), Take by mouth 2 (two) times daily., Disp: , Rfl:  .  NEOMYCIN-POLYMYXIN-HYDROCORTISONE (CORTISPORIN) 1 % SOLN OTIC solution, , Disp: , Rfl:  .  oxyCODONE (ROXICODONE) 15 MG immediate release tablet, Take 1 tablet (15 mg total) by mouth every 6 (six) hours as needed for pain., Disp: 120 tablet, Rfl: 0 .  potassium chloride (K-DUR,KLOR-CON) 10 MEQ tablet, TAKE 2 TABLETS DAILY, Disp: , Rfl:  .  Specialty Vitamins Products (MAGNESIUM, AMINO ACID CHELATE,) 133 MG tablet, Take 1 tablet by mouth 2 (two) times daily., Disp: , Rfl:  .  Testosterone (ANDROGEL PUMP) 20.25 MG/ACT (1.62%) GEL, 20.25 mg., Disp: , Rfl:  .  traZODone (DESYREL) 150 MG tablet, Take 150 mg by mouth every evening., Disp: , Rfl:  .  TURMERIC PO, Take by mouth., Disp: , Rfl:  .  vitamin B-12 (CYANOCOBALAMIN) 100 MCG tablet, Take 100 mcg by mouth daily., Disp: , Rfl:   ROS  Constitutional: Denies any fever or  chills Gastrointestinal: No reported hemesis, hematochezia, vomiting, or acute GI distress Musculoskeletal: Denies any acute onset joint swelling, redness, loss of ROM, or weakness Neurological: No reported episodes of acute onset apraxia, aphasia, dysarthria, agnosia, amnesia, paralysis, loss of coordination, or loss of consciousness  Allergies  Mr. Lantzy is allergic to lyrica [pregabalin].  [place]  Drug: Mr. Bady  reports that he does not use drugs. Alcohol:  reports that he does not drink alcohol. Tobacco:  reports that he has never smoked. His smokeless tobacco use includes Chew. Medical:  has a past medical history of Anxiety; Depression; Hypertension; MRSA carrier; Neuromuscular disorder ([place]); and Opioid-induced constipation (OIC) (04/06/2017). Surgical: Mr. Bonelli  has a past surgical history that includes Spinal cord stimulator insertion and Surgery scrotal / testicular. Family: family history is not on file.  Constitutional Exam  General appearance: Well nourished, well developed, and well hydrated. In no apparent acute distress There were no vitals filed for this visit. BMI Assessment: Estimated body mass index is 44.01 kg/m as calculated from the following:   Height as of 06/23/17: 5' 9"  (1.753 m).   Weight as of 06/23/17: 298 lb (135.2 kg).  BMI interpretation table: BMI level Category Range association with higher incidence of chronic pain  <18 kg/m2 Underweight   18.5-24.9 kg/m2 Ideal body weight   25-29.9 kg/m2 Overweight Increased incidence by 20%  30-34.9 kg/m2 Obese (Class I) Increased incidence by 68%  35-39.9 kg/m2 Severe obesity (Class II) Increased incidence by 136%  >40 kg/m2 Extreme obesity (Class III) Increased incidence by 254%   BMI Readings from Last 4 Encounters:  06/23/17 44.01 kg/m  05/24/17 43.42 kg/m  04/26/17 42.09 kg/m  04/05/17 43.46 kg/m   Wt Readings from Last 4 Encounters:  06/23/17 298 lb (135.2 kg)  05/24/17 294 lb (133.4 kg)  04/26/17  285 lb (129.3 kg)  04/05/17 294 lb 4.8 oz (133.5 kg)  Psych/Mental status: Alert, oriented x 3 (person, place, & time)       Eyes: PERLA Respiratory: No evidence of acute respiratory distress  Cervical Spine Area Exam  Skin & Axial Inspection: No masses, redness, edema, swelling, or associated skin lesions Alignment: Symmetrical Functional  ROM: Unrestricted ROM      Stability: No instability detected Muscle Tone/Strength: Functionally intact. No obvious neuro-muscular anomalies detected. Sensory (Neurological): Unimpaired Palpation: No palpable anomalies              Upper Extremity (UE) Exam    Side: Right upper extremity  Side: Left upper extremity  Skin & Extremity Inspection: Skin color, temperature, and hair growth are WNL. No peripheral edema or cyanosis. No masses, redness, swelling, asymmetry, or associated skin lesions. No contractures.  Skin & Extremity Inspection: Skin color, temperature, and hair growth are WNL. No peripheral edema or cyanosis. No masses, redness, swelling, asymmetry, or associated skin lesions. No contractures.  Functional ROM: Unrestricted ROM          Functional ROM: Unrestricted ROM          Muscle Tone/Strength: Functionally intact. No obvious neuro-muscular anomalies detected.  Muscle Tone/Strength: Functionally intact. No obvious neuro-muscular anomalies detected.  Sensory (Neurological): Unimpaired          Sensory (Neurological): Unimpaired          Palpation: No palpable anomalies              Palpation: No palpable anomalies              Specialized Test(s): Deferred         Specialized Test(s): Deferred          Thoracic Spine Area Exam  Skin & Axial Inspection: No masses, redness, or swelling Alignment: Symmetrical Functional ROM: Unrestricted ROM Stability: No instability detected Muscle Tone/Strength: Functionally intact. No obvious neuro-muscular anomalies detected. Sensory (Neurological): Unimpaired Muscle strength & Tone: No palpable  anomalies  Lumbar Spine Area Exam  Skin & Axial Inspection: No masses, redness, or swelling Alignment: Symmetrical Functional ROM: Unrestricted ROM      Stability: No instability detected Muscle Tone/Strength: Functionally intact. No obvious neuro-muscular anomalies detected. Sensory (Neurological): Unimpaired Palpation: No palpable anomalies       Provocative Tests: Lumbar Hyperextension and rotation test: evaluation deferred today       Lumbar Lateral bending test: evaluation deferred today       Patrick's Maneuver: evaluation deferred today                    Gait & Posture Assessment  Ambulation: Unassisted Gait: Relatively normal for age and body habitus Posture: WNL   Lower Extremity Exam    Side: Right lower extremity  Side: Left lower extremity  Skin & Extremity Inspection: Skin color, temperature, and hair growth are WNL. No peripheral edema or cyanosis. No masses, redness, swelling, asymmetry, or associated skin lesions. No contractures.  Skin & Extremity Inspection: Skin color, temperature, and hair growth are WNL. No peripheral edema or cyanosis. No masses, redness, swelling, asymmetry, or associated skin lesions. No contractures.  Functional ROM: Unrestricted ROM          Functional ROM: Unrestricted ROM          Muscle Tone/Strength: Functionally intact. No obvious neuro-muscular anomalies detected.  Muscle Tone/Strength: Functionally intact. No obvious neuro-muscular anomalies detected.  Sensory (Neurological): Unimpaired  Sensory (Neurological): Unimpaired  Palpation: No palpable anomalies  Palpation: No palpable anomalies   Assessment  Primary Diagnosis & Pertinent Problem List: The primary encounter diagnosis was Chronic pain syndrome. Diagnoses of Chronic upper extremity pain (Primary Source of Pain) (Left), Chronic neck pain (Secondary source of pain) (Bilateral) (L>R), Chronic testicular pain (Tertiary source of pain) (Bilateral) (L>R), History of Complex regional  pain syndrome type I of upper extremity (Left), Opiate use (150 MME/Day), Long term prescription opiate use, and Opioid-induced constipation (OIC) were also pertinent to this visit.  Status Diagnosis  Controlled Controlled Controlled 1. Chronic pain syndrome   2. Chronic upper extremity pain (Primary Source of Pain) (Left)   3. Chronic neck pain (Secondary source of pain) (Bilateral) (L>R)   4. Chronic testicular pain [facility] source of pain) (Bilateral) (L>R)   5. History of Complex regional pain syndrome type I of upper extremity (Left)   6. Opiate use (150 MME/Day)   7. Long term prescription opiate use   8. Opioid-induced constipation (OIC)     Problems updated and reviewed during this visit: Problem  Chronic upper extremity pain (Primary Source of Pain) (Left)  Chronic neck pain (Secondary source of pain) (Bilateral) (L>R)  Chronic testicular pain (Tertiary source of pain) (Bilateral) (L>R)   Plan of Care  Pharmacotherapy (Medications Ordered): No orders of the defined types were placed in this encounter.  New Prescriptions   No medications on file   Medications administered today: Mr. Rosiak had no medications administered during this visit.  Procedure Orders    No procedure(s) ordered today   Lab Orders  No laboratory test(s) ordered today   Imaging Orders  No imaging studies ordered today   Referral Orders  No referral(s) requested today    Interventional management options: Planned, scheduled, and/or pending:   Today we will start tapering his opioids down for the purpose of accomplishing a "Drug Holiday". Return before 08/26/17 for further tapering.   Considering:   Diagnostic right intra-articular shoulder joint injection Diagnostic right suprascapular nerve block Possibleright suprascapular nerve RFA Diagnostic left cervical epidural steroid injection Diagnostic bilateral cervical facet block Possible bilateral cervical facet RFA Diagnostic  bilateral lumbar facet block Possible bilateral lumbar facet RFA   Palliative PRN treatment(s):   None at this time   Provider-requested follow-up: No Follow-up on file.  Future Appointments Date Time Provider [place]  07/20/2017 10:30 AM Milinda Pointer, [place] None   Primary Care Physician: Dellia Beckwith, PA-C Location: [facility] Note by: Gaspar Cola, MD Date: 07/20/2017; Time: 3:10 PM

## 2017-07-20 ENCOUNTER — Ambulatory Visit: Payer: BLUE CROSS/BLUE SHIELD | Attending: Nurse Practitioner | Admitting: Pain Medicine

## 2017-07-21 ENCOUNTER — Ambulatory Visit: Payer: BLUE CROSS/BLUE SHIELD | Admitting: Pain Medicine

## 2017-07-25 DIAGNOSIS — J0100 Acute maxillary sinusitis, unspecified: Secondary | ICD-10-CM | POA: Insufficient documentation

## 2017-07-27 ENCOUNTER — Encounter: Payer: Self-pay | Admitting: Nurse Practitioner

## 2017-07-27 ENCOUNTER — Ambulatory Visit: Payer: BLUE CROSS/BLUE SHIELD | Attending: Nurse Practitioner | Admitting: Nurse Practitioner

## 2017-07-27 VITALS — BP 138/71 | HR 63 | Temp 98.3°F | Resp 16 | Ht 69.0 in | Wt 296.0 lb

## 2017-07-27 DIAGNOSIS — G4700 Insomnia, unspecified: Secondary | ICD-10-CM | POA: Insufficient documentation

## 2017-07-27 DIAGNOSIS — E291 Testicular hypofunction: Secondary | ICD-10-CM | POA: Diagnosis not present

## 2017-07-27 DIAGNOSIS — M542 Cervicalgia: Secondary | ICD-10-CM | POA: Insufficient documentation

## 2017-07-27 DIAGNOSIS — K5903 Drug induced constipation: Secondary | ICD-10-CM | POA: Insufficient documentation

## 2017-07-27 DIAGNOSIS — T402X5A Adverse effect of other opioids, initial encounter: Secondary | ICD-10-CM | POA: Insufficient documentation

## 2017-07-27 DIAGNOSIS — F329 Major depressive disorder, single episode, unspecified: Secondary | ICD-10-CM | POA: Insufficient documentation

## 2017-07-27 DIAGNOSIS — Z22322 Carrier or suspected carrier of Methicillin resistant Staphylococcus aureus: Secondary | ICD-10-CM | POA: Insufficient documentation

## 2017-07-27 DIAGNOSIS — M25511 Pain in right shoulder: Secondary | ICD-10-CM | POA: Diagnosis not present

## 2017-07-27 DIAGNOSIS — Z79899 Other long term (current) drug therapy: Secondary | ICD-10-CM | POA: Diagnosis not present

## 2017-07-27 DIAGNOSIS — N529 Male erectile dysfunction, unspecified: Secondary | ICD-10-CM | POA: Diagnosis not present

## 2017-07-27 DIAGNOSIS — I10 Essential (primary) hypertension: Secondary | ICD-10-CM | POA: Insufficient documentation

## 2017-07-27 DIAGNOSIS — M79602 Pain in left arm: Secondary | ICD-10-CM | POA: Insufficient documentation

## 2017-07-27 DIAGNOSIS — G894 Chronic pain syndrome: Secondary | ICD-10-CM | POA: Insufficient documentation

## 2017-07-27 DIAGNOSIS — Z888 Allergy status to other drugs, medicaments and biological substances status: Secondary | ICD-10-CM | POA: Diagnosis not present

## 2017-07-27 DIAGNOSIS — G90512 Complex regional pain syndrome I of left upper limb: Secondary | ICD-10-CM | POA: Diagnosis not present

## 2017-07-27 DIAGNOSIS — G709 Myoneural disorder, unspecified: Secondary | ICD-10-CM | POA: Insufficient documentation

## 2017-07-27 DIAGNOSIS — D649 Anemia, unspecified: Secondary | ICD-10-CM | POA: Insufficient documentation

## 2017-07-27 DIAGNOSIS — G8929 Other chronic pain: Secondary | ICD-10-CM | POA: Diagnosis not present

## 2017-07-27 DIAGNOSIS — M79642 Pain in left hand: Secondary | ICD-10-CM | POA: Diagnosis present

## 2017-07-27 DIAGNOSIS — F419 Anxiety disorder, unspecified: Secondary | ICD-10-CM | POA: Insufficient documentation

## 2017-07-27 DIAGNOSIS — M545 Low back pain: Secondary | ICD-10-CM | POA: Diagnosis not present

## 2017-07-27 DIAGNOSIS — Z462 Encounter for fitting and adjustment of other devices related to nervous system and special senses: Secondary | ICD-10-CM | POA: Insufficient documentation

## 2017-07-27 MED ORDER — MORPHINE SULFATE ER 30 MG PO TBCR: 30.0000 mg | EXTENDED_RELEASE_TABLET | Freq: Two times a day (BID) | ORAL | 0 refills | Status: DC

## 2017-07-27 MED ORDER — NALOXEGOL OXALATE 25 MG PO TABS: 25.0000 mg | ORAL_TABLET | Freq: Every day | ORAL | 0 refills | Status: DC

## 2017-07-27 MED ORDER — OXYCODONE HCL 15 MG PO TABS: 15.0000 mg | ORAL_TABLET | Freq: Four times a day (QID) | ORAL | 0 refills | Status: DC | PRN

## 2017-07-27 MED ORDER — VARENICLINE TARTRATE 1 MG PO TABS: 1.0000 mg | ORAL_TABLET | Freq: Two times a day (BID) | ORAL | 0 refills | Status: AC

## 2017-07-27 NOTE — Progress Notes (Signed)
Nursing Pain Medication Assessment:  Safety precautions to be maintained throughout the outpatient stay will include: orient to surroundings, keep bed in low position, maintain call bell within reach at all times, provide assistance with transfer out of bed and ambulation.  Medication Inspection Compliance: Pill count conducted under aseptic conditions, in front of the patient. Neither the pills nor the bottle was removed from the patient's sight at any time. Once count was completed pills were immediately returned to the patient in their original bottle.  Medication #1: Oxycodone IR Pill/Patch Count: 49 of 120 pills remain Pill/Patch Appearance: Markings consistent with prescribed medication Bottle Appearance: Standard pharmacy container. Clearly labeled. Filled Date: 09 / 12 / 2018 Last Medication intake:  Today  Medication #2: Morphine ER (MSContin) Pill/Patch Count: 5 of 60 pills remain Pill/Patch Appearance: Markings consistent with prescribed medication Bottle Appearance: Standard pharmacy container. Clearly labeled. Filled Date: 09 / 12 / 2018 Last Medication intake:  Today

## 2017-07-27 NOTE — Patient Instructions (Addendum)

## 2017-07-27 NOTE — Progress Notes (Signed)
Patient's Name: Ian Morse  MRN: [id number]  Referring Provider: Dellia Beckwith, *  DOB: Jan 24, 1971  PCP: Dellia Beckwith, PA-C  DOS: 07/27/2017  Note by: Vevelyn Francois NP  Service setting: Ambulatory outpatient  Specialty: Interventional Pain Management  Location: [facility] (AMB) Pain Management Facility    Patient type: Established    Primary Reason(s) for Visit: Encounter for prescription drug management. (Level of risk: moderate)  CC: Hand Pain (Left RSD)  HPI  Mr. Ian Morse is a 46 y.o. year old, male patient, who comes today for a medication management evaluation. 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); Long term (current) use of opiate analgesic (nonstop since 04/11/2011); Long term prescription opiate use; Opiate use (150 MME/Day); Chronic upper extremity pain (Primary Source of Pain) (Left); Chronic neck pain (Secondary source of pain) (Bilateral) (L>R); Chronic testicular pain [facility] source of pain) (Bilateral) (L>R); Chronic low back pain (Bilateral) (L>R); Spinal cord stimulator status; Encounter for interrogation of neurostimulator; History of Chronic CRPS of upper extremity (Left); Lumbar facet syndrome (Bilateral) (L>R); Opioid-induced constipation (OIC); Long term prescription benzodiazepine use (since before 04/11/2011); Chronic shoulder pain (Right); and Osteoarthritis of shoulder (Right) on his problem list. His primarily concern today is the Hand Pain (Left RSD)  Pain Assessment: Location: Left Hand Radiating: radiates up through arm and into the neck Onset: More than a month ago Duration: Chronic pain Quality: Aching, Dull, Constant Severity: 6 /10  (self-reported pain score)  Note: Reported level is compatible with observation. Clinically the patient looks like a 3/10       When using our objective Pain Scale, levels between 6 and 10/10 are said to belong in an emergency room, as it progressively worsens from a 6/10, described as severely limiting, requiring emergency care not usually available at an outpatient pain management facility. At a 6/10 level, communication becomes difficult and requires great effort. Assistance to reach the emergency department may be required. Facial flushing and profuse sweating along with potentially dangerous increases in heart rate and blood pressure will be evident. Effect on ADL: medications allow him to function during the day Timing: Constant Modifying factors: medications  Mr. Ian Morse was last scheduled for an appointment on 06/23/2017 for medication management. During today's appointment we reviewed Mr. Ian Morse chronic pain status, as well as his outpatient medication regimen. He admits that his pain is stable. He does have problems with constipation and is currently using lactulose. He has failed Amitza along with all the other OTC treatments. He is will to try something else secondary to potassium depletion with use of the lactulose. He would also like to stop "dipping".  He admtis that he has used Chantix before and desires to try this again to help him quit. He is currently being treated for pneumonia.  The patient  reports that he does not use drugs. His body mass index is 43.71 kg/m.  Further details on both, my assessment(s), as well as the proposed treatment plan, please see below.  Controlled Substance Pharmacotherapy Assessment REMS (Risk Evaluation and Mitigation Strategy)  Analgesic: morphine ER 30 mg 1 tablet by mouth twice a day (60 mg/day of morphine) + oxycodone IR 15 mg 1 tablet by mouth every 6 hours (60 mg/day of oxycodone)  MME/day: 157m/day.   Ian Morse  Ian G, RN  07/27/2017 10:44  AM  Sign at close encounter Nursing Pain Medication Assessment:  Safety precautions to be maintained throughout the outpatient stay will include: orient to surroundings, keep bed in low position, maintain call bell within reach at all times, provide assistance with transfer out of bed and ambulation.  Medication Inspection Compliance: Pill count conducted under aseptic conditions, in front of the patient. Neither the pills nor the bottle was removed from the patient's sight at any time. Once count was completed pills were immediately returned to the patient in their original bottle.  Medication #1: Oxycodone IR Pill/Patch Count: 49 of 120 pills remain Pill/Patch Appearance: Markings consistent with prescribed medication Bottle Appearance: Standard pharmacy container. Clearly labeled. Filled Date: 09 / 12 / 2018 Last Medication intake:  Today  Medication #2: Morphine ER (MSContin) Pill/Patch Count: 5 of 60 pills remain Pill/Patch Appearance: Markings consistent with prescribed medication Bottle Appearance: Standard pharmacy container. Clearly labeled. Filled Date: 09 / 12 / 2018 Last Medication intake:  Today   Pharmacokinetics: Liberation and absorption (onset of action): WNL Distribution (time to peak effect): WNL Metabolism and excretion (duration of action): WNL         Pharmacodynamics: Desired effects: Analgesia: Mr. Ian Morse reports >50% benefit. Functional ability: Patient reports that medication allows him to accomplish basic ADLs Clinically meaningful improvement in function (CMIF): Sustained CMIF goals met Perceived effectiveness: Described as relatively effective, allowing for increase in activities of daily living (ADL) Undesirable effects: Side-effects or Adverse reactions: None reported Monitoring: [place] PMP: Online review of the past 48-monthperiod conducted. Compliant with practice rules and regulations Last UDS on record: Summary  Date Value Ref Range Status   04/05/2017 FINAL  Final    Comment:    ==================================================================== TOXASSURE COMP DRUG ANALYSIS,UR ==================================================================== Test                             Result       Flag       Units Drug Present and Declared for Prescription Verification   Morphine                       >8197        EXPECTED   ng/mg creat    Potential sources of large amounts of morphine in the absence of    codeine include administration of morphine or use of heroin.   Oxycodone                      730          EXPECTED   ng/mg creat   Oxymorphone                    330          EXPECTED   ng/mg creat   Noroxycodone                   4178         EXPECTED   ng/mg creat   Noroxymorphone                 179          EXPECTED   ng/mg creat    Sources of oxycodone are scheduled prescription medications.    Oxymorphone, noroxycodone, and noroxymorphone are expected    metabolites of oxycodone. Oxymorphone is also available as a  scheduled prescription medication.   Gabapentin                     PRESENT      EXPECTED   Trazodone                      PRESENT      EXPECTED   1,3 chlorophenyl piperazine    PRESENT      EXPECTED    1,3-chlorophenyl piperazine is an expected metabolite of    trazodone.   Acetaminophen                  PRESENT      EXPECTED Drug Present not Declared for Prescription Verification   Salicylate                     PRESENT      UNEXPECTED Drug Absent but Declared for Prescription Verification   Butalbital                     Not Detected UNEXPECTED   Duloxetine                     Not Detected UNEXPECTED   Fluoxetine                     Not Detected UNEXPECTED ==================================================================== Test                      Result    Flag   Units      Ref Range   Creatinine              122              mg/dL       >=20 ==================================================================== Declared Medications:  The flagging and interpretation on this report are based on the  following declared medications.  Unexpected results may arise from  inaccuracies in the declared medications.  **Note: The testing scope of this panel includes these medications:  Butalbital (Butalbital/APAP/Caffeine)  Duloxetine  Fluoxetine  Gabapentin  Morphine (Morphine Sulfate)  Oxycodone  Trazodone  **Note: The testing scope of this panel does not include small to  moderate amounts of these reported medications:  Acetaminophen (Butalbital/APAP/Caffeine)  **Note: The testing scope of this panel does not include following  reported medications:  Albuterol  Caffeine (Butalbital/APAP/Caffeine)  Calcium Carbonate (Calcium Carb/Vitamin D)  Cyanocobalamin  Fluticasone  Herbal Product  Hydrochlorothiazide (Lisinopril-HCTZ)  Lactulose  Lisinopril (Lisinopril-HCTZ)  Magnesium (Mag)  Multivitamin (MVI)  Potassium  Supplement (Krill Oil)  Testosterone  Turmeric  Vitamin D (Calcium Carb/Vitamin D) ==================================================================== For clinical consultation, please call [phone]. ====================================================================    UDS interpretation: Compliant          Medication Assessment Form: Reviewed. Patient indicates being compliant with therapy Treatment compliance: Compliant Risk Assessment Profile: Aberrant behavior: See prior evaluations. None observed or detected today Comorbid factors increasing risk of overdose: See prior notes. No additional risks detected today Risk of substance use disorder (SUD): Low     Opioid Risk Tool - 07/27/17 1038      Family History of Substance Abuse   Alcohol Positive Male   Illegal Drugs Negative   Rx Drugs Negative     Personal History of Substance Abuse   Alcohol Positive Male or Male   Illegal Drugs  Negative   Rx Drugs Negative  Psychological Disease   Psychological Disease Positive   ADD Negative   OCD Negative   Bipolar Negative   Schizophrenia Negative   Depression Positive  patient is taking cymbalta and drinking smart coffee     Total Score   Opioid Risk Tool Scoring 9   Opioid Risk Interpretation High Risk     ORT Scoring interpretation table:  Score <3 = Low Risk for SUD  Score between 4-7 = Moderate Risk for SUD  Score >8 = High Risk for Opioid Abuse   Risk Mitigation Strategies:  Patient Counseling: Covered Patient-Prescriber Agreement (PPA): Present and active  Notification to other healthcare providers: Done  Pharmacologic Plan: No change in therapy, at this time  Laboratory Chemistry  Inflammation Markers (CRP: Acute Phase) (ESR: Chronic Phase) Lab Results  Component Value Date   CRP 5.7 (H) 04/05/2017   ESRSEDRATE 10 04/05/2017                 Renal Function Markers Lab Results  Component Value Date   BUN 15 04/05/2017   CREATININE 0.68 (L) 04/05/2017   GFRAA 131 04/05/2017   GFRNONAA 114 04/05/2017                 Hepatic Function Markers Lab Results  Component Value Date   AST 16 04/05/2017   ALT 24 04/05/2017   ALBUMIN 4.5 04/05/2017   ALKPHOS 82 04/05/2017                 Electrolytes Lab Results  Component Value Date   NA 144 04/05/2017   K 4.1 04/05/2017   CL 102 04/05/2017   CALCIUM 9.5 04/05/2017   MG 2.1 04/05/2017                 Neuropathy Markers Lab Results  Component Value Date   VITAMINB12 438 04/05/2017                 Bone Pathology Markers Lab Results  Component Value Date   ALKPHOS 82 04/05/2017   25OHVITD1 38 04/05/2017   25OHVITD2 8.1 04/05/2017   25OHVITD3 30 04/05/2017   CALCIUM 9.5 04/05/2017                 Coagulation Parameters Lab Results  Component Value Date   PLT 316 08/21/2014                 Cardiovascular Markers Lab Results  Component Value Date   HGB 12.4 (L) 08/21/2014    HCT 37.8 (L) 08/21/2014                 Note: Lab results reviewed.  Recent Diagnostic Imaging Results  DG Lumbar Spine Complete W/Bend CLINICAL DATA:  Low back pain  EXAM: LUMBAR SPINE - COMPLETE WITH BENDING VIEWS  COMPARISON:  CT 05/18/2006  FINDINGS: Lumbosacral transitional vertebra with S1-S2 disc demonstrating grade 1 anterolisthesis of up to 6 mm involving S1 on S2 on the neutral and flexion lateral projections which reduces upon extension. No spondylolysis is identified. Findings are likely on the basis of degenerative facet arthropathy which are noted at L4-5 and L5-S1. A neurostimulator generator projects over the posterior elements at L4 and L5. No acute fracture nor suspicious osseous lesions.  IMPRESSION: 1. Lumbosacral transitional vertebra with S1-S2 disc. 2. Grade 1 anterolisthesis of S1 on S2 in the neutral and flexion lateral views which appears to reduce upon extension.  Electronically Signed   By: Meredith Leeds.D.  On: 04/20/2017 13:55 DG Cervical Spine Complete CLINICAL DATA:  Neck pain  EXAM: CERVICAL SPINE - COMPLETE 4+ VIEW  COMPARISON:  None.  FINDINGS: There is no evidence of cervical spine fracture or prevertebral soft tissue swelling. The atlantodental interval is within normal limits. No splaying of the lateral masses of C1 on C2. The odontoid process appears intact. Neural stimulator lead projects up to the C4-5 disc level within the central canal posteriorly. Normal cervical lordosis. Slight disc space narrowing anteriorly at C6-7. Facet joints are maintained. No other significant bone abnormalities are identified.  IMPRESSION: 1. Slight disc space narrowing at C6-7. 2. Neural stimulator lead projects up to the C4-5 disc level. 3. No acute osseous abnormality.  Electronically Signed   By: Ashley Royalty M.D.   On: 04/20/2017 13:46  Complexity Note: Imaging results reviewed. Results shared with Mr. Sweetland, using Layman's terms.                          Meds   Current Outpatient Prescriptions:  .  albuterol (PROAIR HFA) 108 (90 Base) MCG/ACT inhaler, Inhale 2 puffs into the lungs as needed., Disp: , Rfl:  .  butalbital-acetaminophen-caffeine (FIORICET, ESGIC) 50-325-40 MG tablet, Take 1 tablet by mouth every 4 (four) hours as needed for headache., Disp: , Rfl:  .  doxycycline (VIBRAMYCIN) 100 MG capsule, Take 1 capsule by mouth 2 (two) times daily., Disp: , Rfl:  .  DULoxetine (CYMBALTA) 60 MG capsule, Take 60 mg by mouth 2 (two) times daily., Disp: , Rfl:  .  fluticasone (FLONASE) 50 MCG/ACT nasal spray, Place 2 sprays into the nose as needed., Disp: , Rfl:  .  gabapentin (NEURONTIN) 800 MG tablet, Take 800 mg by mouth 4 (four) times daily. , Disp: , Rfl:  .  Ginkgo Biloba (GNP GINGKO BILOBA EXTRACT PO), Take by mouth., Disp: , Rfl:  .  lactulose (CHRONULAC) 10 GM/15ML solution, Take 15-30 mLs by mouth once., Disp: , Rfl:  .  lisinopril-hydrochlorothiazide (PRINZIDE,ZESTORETIC) 10-12.5 MG tablet, Take 1 tablet by mouth daily., Disp: , Rfl:  .  [START ON 07/29/2017] morphine (MS CONTIN) 30 MG 12 hr tablet, Take 1 tablet (30 mg total) by mouth 2 (two) times daily., Disp: 60 tablet, Rfl: 0 .  Multiple Vitamins-Minerals (EYE VITAMINS PO), Take by mouth 2 (two) times daily., Disp: , Rfl:  .  naloxone (NARCAN) nasal spray 4 mg/0.1 mL, Place 1 spray into the nose 3 times/day as needed-between meals & bedtime., Disp: , Rfl:  .  NEOMYCIN-POLYMYXIN-HYDROCORTISONE (CORTISPORIN) 1 % SOLN OTIC solution, , Disp: , Rfl:  .  [START ON 07/29/2017] oxyCODONE (ROXICODONE) 15 MG immediate release tablet, Take 1 tablet (15 mg total) by mouth every 6 (six) hours as needed for pain., Disp: 120 tablet, Rfl: 0 .  potassium chloride (K-DUR,KLOR-CON) 10 MEQ tablet, TAKE 2 TABLETS DAILY, Disp: , Rfl:  .  Testosterone (ANDROGEL PUMP) 20.25 MG/ACT (1.62%) GEL, 20.25 mg., Disp: , Rfl:  .  traZODone (DESYREL) 150 MG tablet, Take 150 mg by mouth every  evening., Disp: , Rfl:  .  TURMERIC PO, Take by mouth., Disp: , Rfl:  .  vitamin B-12 (CYANOCOBALAMIN) 100 MCG tablet, Take 100 mcg by mouth daily., Disp: , Rfl:  .  naloxegol oxalate (MOVANTIK) 25 MG TABS tablet, Take 1 tablet (25 mg total) by mouth daily. Take on an empty stomach at least 1 hour before or 2 hours after a meal., Disp: 30 tablet, Rfl: 0 .  varenicline (CHANTIX CONTINUING MONTH PAK) 1 MG tablet, Take 1 tablet (1 mg total) by mouth 2 (two) times daily., Disp: 60 tablet, Rfl: 0  ROS  Constitutional: Denies any fever or chills Gastrointestinal: No reported hemesis, hematochezia, vomiting, or acute GI distress Musculoskeletal: Denies any acute onset joint swelling, redness, loss of ROM, or weakness Neurological: No reported episodes of acute onset apraxia, aphasia, dysarthria, agnosia, amnesia, paralysis, loss of coordination, or loss of consciousness  Allergies  Mr. Leveque is allergic to lyrica [pregabalin].  [place]  Drug: Mr. Tagliaferro  reports that he does not use drugs. Alcohol:  reports that he does not drink alcohol. Tobacco:  reports that he has never smoked. His smokeless tobacco use includes Chew. Medical:  has a past medical history of Anxiety; Depression; Hypertension; MRSA carrier; Neuromuscular disorder ([place]); and Opioid-induced constipation (OIC) (04/06/2017). Surgical: Mr. Tetrault  has a past surgical history that includes Spinal cord stimulator insertion and Surgery scrotal / testicular. Family: family history is not on file.  Constitutional Exam  General appearance: Well nourished, well developed, and well hydrated. In no apparent acute distress Vitals:   07/27/17 1016  BP: 138/71  Pulse: 63  Resp: 16  Temp: 98.3 F (36.8 C)  TempSrc: Oral  SpO2: 99%  Weight: 296 lb (134.3 kg)  Height: _0  (1.753 m)   BMI Assessment: Estimated body mass index is 43.71 kg/m as calculated from the following:   Height as of this encounter: _1  (1.753 m).   Weight as of this  encounter: 296 lb (134.3 kg). Psych/Mental status: Alert, oriented x 3 (person, place, & time)       Eyes: PERLA Respiratory: No evidence of acute respiratory distress  Cervical Spine Area Exam  Skin & Axial Inspection: No masses, redness, edema, swelling, or associated skin lesions Alignment: Symmetrical Functional ROM: Unrestricted ROM      Stability: No instability detected Muscle Tone/Strength: Functionally intact. No obvious neuro-muscular anomalies detected. Sensory (Neurological): Unimpaired Palpation: No palpable anomalies              Upper Extremity (UE) Exam    Side: Right upper extremity  Side: Left upper extremity  Skin & Extremity Inspection: Skin color, temperature, and hair growth are WNL. No peripheral edema or cyanosis. No masses, redness, swelling, asymmetry, or associated skin lesions. No contractures.  Skin & Extremity Inspection: Skin color, temperature, and hair growth are WNL. No peripheral edema or cyanosis. No masses, redness, swelling, asymmetry, or associated skin lesions. No contractures.  Functional ROM: Unrestricted ROM          Functional ROM: Unrestricted ROM          Muscle Tone/Strength: Functionally intact. No obvious neuro-muscular anomalies detected.  Muscle Tone/Strength: Functionally intact. No obvious neuro-muscular anomalies detected.  Sensory (Neurological): Unimpaired          Sensory (Neurological): Unimpaired          Palpation: No palpable anomalies              Palpation: No palpable anomalies              Specialized Test(s): Deferred         Specialized Test(s): Deferred          Thoracic Spine Area Exam  Skin & Axial Inspection: No masses, redness, or swelling Alignment: Symmetrical Functional ROM: Unrestricted ROM Stability: No instability detected Muscle Tone/Strength: Functionally intact. No obvious neuro-muscular anomalies detected. Sensory (Neurological): Unimpaired Muscle strength & Tone: No palpable anomalies  Lumbar Spine Area  Exam  Skin & Axial Inspection: No masses, redness, or swelling Alignment: Symmetrical Functional ROM: Unrestricted ROM      Stability: No instability detected Muscle Tone/Strength: Functionally intact. No obvious neuro-muscular anomalies detected. Sensory (Neurological): Unimpaired Palpation: No palpable anomalies       Provocative Tests: Lumbar Hyperextension and rotation test: evaluation deferred today       Lumbar Lateral bending test: evaluation deferred today       Patrick's Maneuver: evaluation deferred today                    Gait & Posture Assessment  Ambulation: Unassisted Gait: Relatively normal for age and body habitus Posture: WNL   Lower Extremity Exam    Side: Right lower extremity  Side: Left lower extremity  Skin & Extremity Inspection: Skin color, temperature, and hair growth are WNL. No peripheral edema or cyanosis. No masses, redness, swelling, asymmetry, or associated skin lesions. No contractures.  Skin & Extremity Inspection: Skin color, temperature, and hair growth are WNL. No peripheral edema or cyanosis. No masses, redness, swelling, asymmetry, or associated skin lesions. No contractures.  Functional ROM: Unrestricted ROM          Functional ROM: Unrestricted ROM          Muscle Tone/Strength: Functionally intact. No obvious neuro-muscular anomalies detected.  Muscle Tone/Strength: Functionally intact. No obvious neuro-muscular anomalies detected.  Sensory (Neurological): Unimpaired  Sensory (Neurological): Unimpaired  Palpation: No palpable anomalies  Palpation: No palpable anomalies   Assessment  Primary Diagnosis & Pertinent Problem List: The primary encounter diagnosis was Complex regional pain syndrome type 1 of left upper extremity. Diagnoses of Chronic shoulder pain (Right), Chronic upper extremity pain (Primary Source of Pain) (Left), and Chronic pain syndrome were also pertinent to this visit.  Status Diagnosis  Controlled Controlled Controlled 1.  Complex regional pain syndrome type 1 of left upper extremity   2. Chronic shoulder pain (Right)   3. Chronic upper extremity pain (Primary Source of Pain) (Left)   4. Chronic pain syndrome     Problems updated and reviewed during this visit: No problems updated. Plan of Care  Pharmacotherapy (Medications Ordered): Meds ordered this encounter  Medications  . morphine (MS CONTIN) 30 MG 12 hr tablet    Sig: Take 1 tablet (30 mg total) by mouth 2 (two) times daily.    Dispense:  60 tablet    Refill:  0    Fill one day early if pharmacy is closed on scheduled refill date. Do not fill until:07/29/2017 To last until:08/28/2017    Order Specific Question:   Supervising Provider    Answer:   Milinda Pointer [phone]  . oxyCODONE (ROXICODONE) 15 MG immediate release tablet    Sig: Take 1 tablet (15 mg total) by mouth every 6 (six) hours as needed for pain.    Dispense:  120 tablet    Refill:  0    Fill one day early if pharmacy is closed on scheduled refill date. Do not fill until:07/29/2017 To last until: 08/28/2017    Order Specific Question:   Supervising Provider    Answer:   Milinda Pointer [phone]  . naloxegol oxalate (MOVANTIK) 25 MG TABS tablet    Sig: Take 1 tablet (25 mg total) by mouth daily. Take on an empty stomach at least 1 hour before or 2 hours after a meal.    Dispense:  30 tablet    Refill:  0  Please instruct the patient not to break the tablet.    Order Specific Question:   Supervising Provider    Answer:   Milinda Pointer [phone]  . varenicline (CHANTIX CONTINUING MONTH PAK) 1 MG tablet    Sig: Take 1 tablet (1 mg total) by mouth 2 (two) times daily.    Dispense:  60 tablet    Refill:  0    Order Specific Question:   Supervising Provider    Answer:   Milinda Pointer [phone]   New Prescriptions   NALOXEGOL OXALATE (MOVANTIK) 25 MG TABS TABLET    Take 1 tablet (25 mg total) by mouth daily. Take on an empty stomach at least 1 hour before or 2 hours  after a meal.   VARENICLINE (CHANTIX CONTINUING MONTH PAK) 1 MG TABLET    Take 1 tablet (1 mg total) by mouth 2 (two) times daily.   Medications administered today: Mr. Costilla had no medications administered during this visit. Lab-work, procedure(s), and/or referral(s): No orders of the defined types were placed in this encounter.  Imaging and/or referral(s): None  Interventional management options: Planned, scheduled, and/or pending:  On the patient's next medication management visit, we will be switching him to oxycodone and start a tapering in order to accomplish a "drug holiday".    Considering:  Diagnostic right intra-articular shoulder joint injection Diagnostic right suprascapular nerve block Possibleright suprascapular nerve RFA Diagnostic left cervical epidural steroid injection Diagnostic bilateral cervical facet block Possible bilateral cervical facet RFA Diagnostic bilateral lumbar facet block Possible bilateral lumbar facet RFA   PRN Procedures:  To be determined at a later time   Provider-requested follow-up: Return in about 4 weeks (around 08/24/2017).  Future Appointments Date Time Provider [place]  08/24/2017 10:15 AM Milinda Pointer, [place] None   Primary Care Physician: Dellia Beckwith, PA-C Location: [facility] Note by: Vevelyn Francois NP Date: 07/27/2017; Time: 11:49 AM  Pain Score Disclaimer: We use the NRS-11 scale. This is a self-reported, subjective measurement of pain severity with only modest accuracy. It is used primarily to identify changes within a particular patient. It must be understood that outpatient pain scales are significantly less accurate that those used for research, where they can be applied under ideal controlled circumstances with minimal exposure to variables. In reality, the score is likely to be a combination of pain intensity and pain affect, where pain affect  describes the degree of emotional arousal or changes in action readiness caused by the sensory experience of pain. Factors such as social and work situation, setting, emotional state, anxiety levels, expectation, and prior pain experience may influence pain perception and show large inter-individual differences that may also be affected by time variables.  Patient instructions provided during this appointment: Patient Instructions    ____________________________________________________________________________________________  Medication Rules  Applies to: All patients receiving prescriptions (written or electronic).  Pharmacy of record: Pharmacy where electronic prescriptions will be sent. If written prescriptions are taken to a different pharmacy, please inform the nursing staff. The pharmacy listed in the electronic medical record should be the one where you would like electronic prescriptions to be sent.  Prescription refills: Only during scheduled appointments. Applies to both, written and electronic prescriptions.  NOTE: The following applies primarily to controlled substances (Opioid* Pain Medications).   Patient's responsibilities: 1. Pain Pills: Bring all pain pills to every appointment (except for procedure appointments). 2. Pill Bottles: Bring pills in original pharmacy bottle. Always bring newest bottle. Bring bottle,  even if empty. 3. Medication refills: You are responsible for knowing and keeping track of what medications you need refilled. The day before your appointment, write a list of all prescriptions that need to be refilled. Bring that list to your appointment and give it to the admitting nurse. Prescriptions will be written only during appointments. If you forget a medication, it will not be "Called in", "Faxed", or "electronically sent". You will need to get another appointment to get these prescribed. 4. Prescription Accuracy: You are responsible for carefully inspecting  your prescriptions before leaving our office. Have the discharge nurse carefully go over each prescription with you, before taking them home. Make sure that your name is accurately spelled, that your address is correct. Check the name and dose of your medication to make sure it is accurate. Check the number of pills, and the written instructions to make sure they are clear and accurate. Make sure that you are given enough medication to last until your next medication refill appointment. 5. Taking Medication: Take medication as prescribed. Never take more pills than instructed. Never take medication more frequently than prescribed. Taking less pills or less frequently is permitted and encouraged, when it comes to controlled substances (written prescriptions).  6. Inform other Doctors: Always inform, all of your healthcare providers, of all the medications you take. 7. Pain Medication from other Providers: You are not allowed to accept any additional pain medication from any other Doctor or Healthcare provider. There are two exceptions to this rule. (see below) In the event that you require additional pain medication, you are responsible for notifying us, as stated below. 8. Medication Agreement: You are responsible for carefully reading and following our Medication Agreement. This must be signed before receiving any prescriptions from our practice. Safely store a copy of your signed Agreement. Violations to the Agreement will result in no further prescriptions. (Additional copies of our Medication Agreement are available upon request.) 9. Laws, Rules, & Regulations: All patients are expected to follow all Federal and [employer], Rules, [employer]. Ignorance of the Laws does not constitute a valid excuse. The use of any illegal substances is prohibited. 10. Adopted CDC guidelines & recommendations: Target dosing levels will be at or below 60 MME/day. Use of benzodiazepines** is not  recommended.  Exceptions: There are only two exceptions to the rule of not receiving pain medications from other Healthcare Providers. 1. Exception #1 (Emergencies): In the event of an emergency (i.e.: accident requiring emergency care), you are allowed to receive additional pain medication. However, you are responsible for: As soon as you are able, call our office [phone], at any time of the day or night, and leave a message stating your name, the date and nature of the emergency, and the name and dose of the medication prescribed. In the event that your call is answered by a member of our staff, make sure to document and save the date, time, and the name of the person that took your information.  2. Exception #2 (Planned Surgery): In the event that you are scheduled by another doctor or dentist to have any type of surgery or procedure, you are allowed (for a period no longer than 30 days), to receive additional pain medication, for the acute post-op pain. However, in this case, you are responsible for picking up a copy of our "Post-op Pain Management for Surgeons" handout, and giving it to your surgeon or dentist. This document is available at our office, and does not  require an appointment to obtain it. Simply go to our office during business hours (Monday-Thursday from 8:00 AM to 4:00 PM) (Friday 8:00 AM to 12:00 Noon) or if you have a scheduled appointment with [place], prior to your surgery, and ask for it by name. In addition, you will need to provide [place] with your name, name of your surgeon, type of surgery, and date of procedure or surgery.  *Opioid medications include: morphine, codeine, oxycodone, oxymorphone, hydrocodone, hydromorphone, meperidine, tramadol, tapentadol, buprenorphine, fentanyl, methadone. **Benzodiazepine medications include: diazepam (Valium), alprazolam (Xanax), clonazepam (Klonopine), lorazepam (Ativan), clorazepate (Tranxene), chlordiazepoxide (Librium), estazolam (Prosom),  oxazepam (Serax), temazepam (Restoril), triazolam (Halcion)  ____________________________________________________________________________________________ ____________________________________________________________________________________________  Pain Scale  Introduction: The pain score used by this practice is the Verbal Numerical Rating Scale (VNRS-11). This is an 11-point scale. It is for adults and children 10 years or older. There are significant differences in how the pain score is reported, used, and applied. Forget everything you learned in the past and learn this scoring system.  General Information: The scale should reflect your current level of pain. Unless you are specifically asked for the level of your worst pain, or your average pain. If you are asked for one of these two, then it should be understood that it is over the past 24 hours.  Basic Activities of Daily Living (ADL): Personal hygiene, dressing, eating, transferring, and using restroom.  Instructions: Most patients tend to report their level of pain as a combination of two factors, their physical pain and their psychosocial pain. This last one is also known as "suffering" and it is reflection of how physical pain affects you socially and psychologically. From now on, report them separately. From this point on, when asked to report your pain level, report only your physical pain. Use the following table for reference.  Pain Clinic Pain Levels (0-5/10)  Pain Level Score  Description  No Pain 0   Mild pain 1 Nagging, annoying, but does not interfere with basic activities of daily living (ADL). Patients are able to eat, bathe, get dressed, toileting (being able to get on and off the toilet and perform personal hygiene functions), transfer (move in and out of bed or a chair without assistance), and maintain continence (able to control bladder and bowel functions). Blood pressure and heart rate are unaffected. A normal heart rate  for a healthy adult ranges from 60 to 100 bpm (beats per minute).   Mild to moderate pain 2 Noticeable and distracting. Impossible to hide from other people. More frequent flare-ups. Still possible to adapt and function close to normal. It can be very annoying and may have occasional stronger flare-ups. With discipline, patients may get used to it and adapt.   Moderate pain 3 Interferes significantly with activities of daily living (ADL). It becomes difficult to feed, bathe, get dressed, get on and off the toilet or to perform personal hygiene functions. Difficult to get in and out of bed or a chair without assistance. Very distracting. With effort, it can be ignored when deeply involved in activities.   Moderately severe pain 4 Impossible to ignore for more than a few minutes. With effort, patients may still be able to manage work or participate in some social activities. Very difficult to concentrate. Signs of autonomic nervous system discharge are evident: dilated pupils (mydriasis); mild sweating (diaphoresis); sleep interference. Heart rate becomes elevated (>115 bpm). Diastolic blood pressure (lower number) rises above 100 mmHg. Patients find relief in laying down and not moving.  Severe pain 5 Intense and extremely unpleasant. Associated with frowning face and frequent crying. Pain overwhelms the senses.  Ability to do any activity or maintain social relationships becomes significantly limited. Conversation becomes difficult. Pacing back and forth is common, as getting into a comfortable position is nearly impossible. Pain wakes you up from deep sleep. Physical signs will be obvious: pupillary dilation; increased sweating; goosebumps; brisk reflexes; cold, clammy hands and feet; nausea, vomiting or dry heaves; loss of appetite; significant sleep disturbance with inability to fall asleep or to remain asleep. When persistent, significant weight loss is observed due to the complete loss of appetite and  sleep deprivation.  Blood pressure and heart rate becomes significantly elevated. Caution: If elevated blood pressure triggers a pounding headache associated with blurred vision, then the patient should immediately seek attention at an urgent or emergency care unit, as these may be signs of an impending stroke.    Emergency Department Pain Levels (6-10/10)  Emergency Room Pain 6 Severely limiting. Requires emergency care and should not be seen or managed at an outpatient pain management facility. Communication becomes difficult and requires great effort. Assistance to reach the emergency department may be required. Facial flushing and profuse sweating along with potentially dangerous increases in heart rate and blood pressure will be evident.   Distressing pain 7 Self-care is very difficult. Assistance is required to transport, or use restroom. Assistance to reach the emergency department will be required. Tasks requiring coordination, such as bathing and getting dressed become very difficult.   Disabling pain 8 Self-care is no longer possible. At this level, pain is disabling. The individual is unable to do even the most "basic" activities such as walking, eating, bathing, dressing, transferring to a bed, or toileting. Fine motor skills are lost. It is difficult to think clearly.   Incapacitating pain 9 Pain becomes incapacitating. Thought processing is no longer possible. Difficult to remember your own name. Control of movement and coordination are lost.   The worst pain imaginable 10 At this level, most patients pass out from pain. When this level is reached, collapse of the autonomic nervous system occurs, leading to a sudden drop in blood pressure and heart rate. This in turn results in a temporary and dramatic drop in blood flow to the brain, leading to a loss of consciousness. Fainting is one of the body's self defense mechanisms. Passing out puts the brain in a calmed state and causes it to shut  down for a while, in order to begin the healing process.    Summary: 1. Refer to this scale when providing [place] with your pain level. 2. Be accurate and careful when reporting your pain level. This will help with your care. 3. Over-reporting your pain level will lead to loss of credibility. 4. Even a level of 1/10 means that there is pain and will be treated at our facility. 5. High, inaccurate reporting will be documented as "Symptom Exaggeration", leading to loss of credibility and suspicions of possible secondary gains such as obtaining more narcotics, or wanting to appear disabled, for fraudulent reasons. 6. Only pain levels of 5 or below will be seen at our facility. 7. Pain levels of 6 and above will be sent to the Emergency Department and the appointment cancelled. ____________________________________________________________________________________________

## 2017-08-23 DIAGNOSIS — M4310 Spondylolisthesis, site unspecified: Secondary | ICD-10-CM | POA: Insufficient documentation

## 2017-08-23 DIAGNOSIS — M5030 Other cervical disc degeneration, unspecified cervical region: Secondary | ICD-10-CM | POA: Insufficient documentation

## 2017-08-23 NOTE — Progress Notes (Signed)
Patient's Name: Ian Morse  MRN: [id number]  Referring Provider: Dellia Beckwith, *  DOB: 22-Feb-1971  PCP: Dellia Beckwith, PA-C  DOS: 08/24/2017  Note by: Gaspar Cola, MD  Service setting: Ambulatory outpatient  Specialty: Interventional Pain Management  Location: [facility] (AMB) Pain Management Facility    Patient type: Established   Primary Reason(s) for Visit: Encounter for prescription drug management. (Level of risk: moderate)  CC: Arm Pain (left arm) and Hand Pain (left)  HPI  Ian Morse is a 46 y.o. year old, male patient, who comes today for a medication management evaluation. 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); Long term (current) use of opiate analgesic (nonstop since 04/11/2011); Long term prescription opiate use; Opiate use (150 MME/Day); Chronic upper extremity pain (Primary Source of Pain) (Left); Chronic neck pain (Secondary source of pain) (Bilateral) (L>R); Chronic testicular pain [facility] source of pain) (Bilateral) (L>R); Chronic low back pain (Bilateral) (L>R); Spinal cord stimulator status; Encounter for interrogation of neurostimulator; History of Chronic CRPS of upper extremity (Left); Lumbar facet syndrome (Bilateral) (L>R); Opioid-induced constipation (OIC); Long term prescription benzodiazepine use (since before 04/11/2011); Chronic shoulder pain (Right); Osteoarthritis of shoulder (Right); DDD (degenerative disc disease), cervical (worst at C6-7); 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 His primarily concern today is the Arm Pain (left  arm) and Hand Pain (left)  Pain Assessment: Location: Left Arm Radiating: all the way from left hand to left shoulder Onset: More than a month ago Duration: Chronic pain Quality: Aching, Constant, Radiating, Sharp Severity: 5 /10 (self-reported pain score)  Note: Reported level is compatible with observation.                         When using our objective Pain Scale, levels between 6 and 10/10 are said to belong in an emergency room, as it progressively worsens from a 6/10, described as severely limiting, requiring emergency care not usually available at an outpatient pain management facility. At a 6/10 level, communication becomes difficult and requires great effort. Assistance to reach the emergency department may be required. Facial flushing and profuse sweating along with potentially dangerous increases in heart rate and blood pressure will be evident. Effect on ADL: pace self Timing: Constant Modifying factors: medicine,  pt  has stimulator  Ian Morse was last scheduled for an appointment on 07/20/2017 for medication management. During today's appointment we reviewed Ian Morse chronic pain status, as well as his outpatient medication regimen.  He is still having some problems with opioid-induced constipation and the Movantik did not help and it gave him side effects.  Because of this, we will be doing a trial of Amitiza.  I have also recommended that he take Benefiber.  Today we will start his opioid tapering and we will be going down by 5 mg/week.  I will be seeing him back before 11/26/2017, when he is running out of medicine.  At that time, I will convert his extended release morphine to immediate release oxycodone and continue to taper it down until we can completely eliminate it.  Topics covered today: opioid tolerance, "Drug Holidays", Ian Morse primary cause of pain, the treatment plan, treatment  alternatives, medication side effects, the goals of pain management (increased in  functionality), the need to bring and keep the BMI below 30 and the need to collect and read the AVS material.  The patient  reports that he does not use drugs. His body mass index is 43.27 kg/m.  Further details on both, my assessment(s), as well as the proposed treatment plan, please see below.  Controlled Substance Pharmacotherapy Assessment REMS (Risk Evaluation and Mitigation Strategy)  Analgesic: morphine ER 30 mg 1 tablet by mouth twice a day (60 mg/day of morphine) + oxycodone IR 15 mg 1 tablet by mouth every 6 hours (60 mg/day of oxycodone)  MME/day: 110m/day.  NOTE: Today we will commence the tapering of his opioid at a rate of 5 mg of oxycodone per week.  Based on my calculations, the patient will be completely off of the oxycodone by 11/13/2017.  Today I will give him a series of prescriptions that we will need to have filled on a weekly basis, so as to lower the dose slowly.  Once I finished lowering the immediate release oxycodone, then we will convert his extended release morphine to immediate release oxycodone and repeat the process until we can have him completely off of the opioids. PLona Millard RN  08/24/2017 10:22 AM  Sign at close encounter Nursing Pain Medication Assessment:  Safety precautions to be maintained throughout the outpatient stay will include: orient to surroundings, keep bed in low position, maintain call bell within reach at all times, provide assistance with transfer out of bed and ambulation.  Medication Inspection Compliance: Pill count conducted under aseptic conditions, in front of the patient. Neither the pills nor the bottle was removed from the patient's sight at any time. Once count was completed pills were immediately returned to the patient in their original bottle.  Medication #1: Morphine ER (MSContin) Pill/Patch Count: 9 of 60 pills remain Pill/Patch Appearance: Markings consistent with prescribed medication Bottle Appearance: Standard pharmacy  container. Clearly labeled. Filled Date: 127/ 12 / 2018 Last Medication intake:  Today  Medication #2: Oxycodone IR Pill/Patch Count: 83 of 120 pills remain Pill/Patch Appearance: Markings consistent with prescribed medication Bottle Appearance: Standard pharmacy container. Clearly labeled. Filled Date: 10/12/ 2018 Last Medication intake:  Today   Pharmacokinetics: Liberation and absorption (onset of action): WNL Distribution (time to peak effect): WNL Metabolism and excretion (duration of action): WNL         Pharmacodynamics: Desired effects: Analgesia: Mr. DHetzerreports >50% benefit. Functional ability: Patient reports that medication allows him to accomplish basic ADLs Clinically meaningful improvement in function (CMIF): Sustained CMIF goals met Perceived effectiveness: Described as relatively effective, allowing for increase in activities of daily living (ADL) Undesirable effects: Side-effects or Adverse reactions: None reported Monitoring: [place] PMP: Online review of the past 159-montheriod conducted. Compliant with practice rules and regulations Last UDS on record: Summary  Date Value Ref Range Status  04/05/2017 FINAL  Final    Comment:    ==================================================================== TOXASSURE COMP DRUG ANALYSIS,UR ==================================================================== Test                             Result       Flag       Units Drug Present and Declared for Prescription Verification   Morphine                       >8197  EXPECTED   ng/mg creat    Potential sources of large amounts of morphine in the absence of    codeine include administration of morphine or use of heroin.   Oxycodone                      730          EXPECTED   ng/mg creat   Oxymorphone                    330          EXPECTED   ng/mg creat   Noroxycodone                   4178         EXPECTED   ng/mg creat   Noroxymorphone                 179           EXPECTED   ng/mg creat    Sources of oxycodone are scheduled prescription medications.    Oxymorphone, noroxycodone, and noroxymorphone are expected    metabolites of oxycodone. Oxymorphone is also available as a    scheduled prescription medication.   Gabapentin                     PRESENT      EXPECTED   Trazodone                      PRESENT      EXPECTED   1,3 chlorophenyl piperazine    PRESENT      EXPECTED    1,3-chlorophenyl piperazine is an expected metabolite of    trazodone.   Acetaminophen                  PRESENT      EXPECTED Drug Present not Declared for Prescription Verification   Salicylate                     PRESENT      UNEXPECTED Drug Absent but Declared for Prescription Verification   Butalbital                     Not Detected UNEXPECTED   Duloxetine                     Not Detected UNEXPECTED   Fluoxetine                     Not Detected UNEXPECTED ==================================================================== Test                      Result    Flag   Units      Ref Range   Creatinine              122              mg/dL      >=20 ==================================================================== Declared Medications:  The flagging and interpretation on this report are based on the  following declared medications.  Unexpected results may arise from  inaccuracies in the declared medications.  **Note: The testing scope of this panel includes these medications:  Butalbital (Butalbital/APAP/Caffeine)  Duloxetine  Fluoxetine  Gabapentin  Morphine (Morphine Sulfate)  Oxycodone  Trazodone  **Note: The testing scope of this panel does not include small to  moderate amounts of these reported medications:  Acetaminophen (Butalbital/APAP/Caffeine)  **Note: The testing scope of this panel does not include following  reported medications:  Albuterol  Caffeine (Butalbital/APAP/Caffeine)  Calcium Carbonate (Calcium Carb/Vitamin D)  Cyanocobalamin   Fluticasone  Herbal Product  Hydrochlorothiazide (Lisinopril-HCTZ)  Lactulose  Lisinopril (Lisinopril-HCTZ)  Magnesium (Mag)  Multivitamin (MVI)  Potassium  Supplement (Krill Oil)  Testosterone  Turmeric  Vitamin D (Calcium Carb/Vitamin D) ==================================================================== For clinical consultation, please call [phone]. ====================================================================    UDS interpretation: Unexpected findings not considered significantly abnormal          Medication Assessment Form: Reviewed. Patient indicates being compliant with therapy Treatment compliance: Compliant Risk Assessment Profile: Aberrant behavior: See prior evaluations. None observed or detected today Comorbid factors increasing risk of overdose: See prior notes. No additional risks detected today Risk of substance use disorder (SUD): Low Opioid Risk Tool - 08/24/17 1012      Family History of Substance Abuse   Alcohol  Positive Male    Illegal Drugs  Negative    Rx Drugs  Negative      Personal History of Substance Abuse   Alcohol  Positive Male or Male    Illegal Drugs  Negative    Rx Drugs  Negative      Age   Age between 87-45 years   No      History of Preadolescent Sexual Abuse   History of Preadolescent Sexual Abuse  Negative or Male      Psychological Disease   Psychological Disease  Negative    ADD  Negative    OCD  Negative    Bipolar  Negative    Depression  Negative      Total Score   Opioid Risk Tool Scoring  6    Opioid Risk Interpretation  Moderate Risk      ORT Scoring interpretation table:  Score <3 = Low Risk for SUD  Score between 4-7 = Moderate Risk for SUD  Score >8 = High Risk for Opioid Abuse   Risk Mitigation Strategies:  Patient Counseling: Covered Patient-Prescriber Agreement (PPA): Present and active  Notification to other healthcare providers: Done  Pharmacologic Plan: No change in therapy, at  this time  Laboratory Chemistry  Inflammation Markers (CRP: Acute Phase) (ESR: Chronic Phase) Lab Results  Component Value Date   CRP 5.7 (H) 04/05/2017   ESRSEDRATE 10 04/05/2017                 Renal Function Markers Lab Results  Component Value Date   BUN 15 04/05/2017   CREATININE 0.68 (L) 04/05/2017   GFRAA 131 04/05/2017   GFRNONAA 114 04/05/2017                 Hepatic Function Markers Lab Results  Component Value Date   AST 16 04/05/2017   ALT 24 04/05/2017   ALBUMIN 4.5 04/05/2017   ALKPHOS 82 04/05/2017                 Electrolytes Lab Results  Component Value Date   NA 144 04/05/2017   K 4.1 04/05/2017   CL 102 04/05/2017   CALCIUM 9.5 04/05/2017   MG 2.1 04/05/2017                 Neuropathy Markers Lab Results  Component Value Date   VITAMINB12 438 04/05/2017                 Bone Pathology Markers Lab Results  Component Value Date   ALKPHOS 82 04/05/2017   25OHVITD1 38 04/05/2017   25OHVITD2 8.1 04/05/2017   25OHVITD3 30 04/05/2017   CALCIUM 9.5 04/05/2017                 Coagulation Parameters Lab Results  Component Value Date   PLT 316 08/21/2014                 Cardiovascular Markers Lab Results  Component Value Date   HGB 12.4 (L) 08/21/2014   HCT 37.8 (L) 08/21/2014                 Note: Lab results reviewed.  Recent Diagnostic Imaging Results  DG Lumbar Spine Complete W/Bend CLINICAL DATA:  Low back pain  EXAM: LUMBAR SPINE - COMPLETE WITH BENDING VIEWS  COMPARISON:  CT 05/18/2006  FINDINGS: Lumbosacral transitional vertebra with S1-S2 disc demonstrating grade 1 anterolisthesis of up to 6 mm involving S1 on S2 on the neutral and flexion lateral projections which reduces upon extension. No spondylolysis is identified. Findings are likely on the basis of degenerative facet arthropathy which are noted at L4-5 and L5-S1. A neurostimulator generator projects over the posterior elements at L4 and L5. No acute fracture  nor suspicious osseous lesions.  IMPRESSION: 1. Lumbosacral transitional vertebra with S1-S2 disc. 2. Grade 1 anterolisthesis of S1 on S2 in the neutral and flexion lateral views which appears to reduce upon extension.  Electronically Signed   By: Ashley Royalty M.D.   On: 04/20/2017 13:55 DG Cervical Spine Complete CLINICAL DATA:  Neck pain  EXAM: CERVICAL SPINE - COMPLETE 4+ VIEW  COMPARISON:  None.  FINDINGS: There is no evidence of cervical spine fracture or prevertebral soft tissue swelling. The atlantodental interval is within normal limits. No splaying of the lateral masses of C1 on C2. The odontoid process appears intact. Neural stimulator lead projects up to the C4-5 disc level within the central canal posteriorly. Normal cervical lordosis. Slight disc space narrowing anteriorly at C6-7. Facet joints are maintained. No other significant bone abnormalities are identified.  IMPRESSION: 1. Slight disc space narrowing at C6-7. 2. Neural stimulator lead projects up to the C4-5 disc level. 3. No acute osseous abnormality.  Electronically Signed   By: Ashley Royalty M.D.   On: 04/20/2017 13:46  Complexity Note: Imaging results reviewed. Results shared with Ian Morse, using Layman's terms.                         Meds   Current Outpatient Medications:  .  albuterol (PROAIR HFA) 108 (90 Base) MCG/ACT inhaler, Inhale 2 puffs into the lungs as needed., Disp: , Rfl:  .  butalbital-acetaminophen-caffeine (FIORICET, ESGIC) 50-325-40 MG tablet, Take 1 tablet by mouth every 4 (four) hours as needed for headache., Disp: , Rfl:  .  DULoxetine (CYMBALTA) 60 MG capsule, Take 60 mg by mouth 2 (two) times daily., Disp: , Rfl:  .  fluticasone (FLONASE) 50 MCG/ACT nasal spray, Place 2 sprays into the nose as needed., Disp: , Rfl:  .  gabapentin (NEURONTIN) 800 MG tablet, Take 800 mg by mouth 4 (four) times daily. , Disp: , Rfl:  .  Ginkgo Biloba (GNP GINGKO BILOBA EXTRACT PO), Take daily  at 6 (six) AM by mouth. , Disp: , Rfl:  .  lactulose (CHRONULAC) 10 GM/15ML solution, Take 15-30 mLs by mouth once., Disp: , Rfl:  .  lisinopril-hydrochlorothiazide (PRINZIDE,ZESTORETIC) 10-12.5 MG tablet, Take 1 tablet by  mouth daily., Disp: , Rfl:  .  [START ON 08/28/2017] morphine (MS CONTIN) 30 MG 12 hr tablet, Take 1 tablet (30 mg total) 2 (two) times daily by mouth., Disp: 60 tablet, Rfl: 0 .  [START ON 09/27/2017] morphine (MS CONTIN) 30 MG 12 hr tablet, Take 1 tablet (30 mg total) every 12 (twelve) hours by mouth., Disp: 60 tablet, Rfl: 0 .  [START ON 10/27/2017] morphine (MS CONTIN) 30 MG 12 hr tablet, Take 1 tablet (30 mg total) every 12 (twelve) hours by mouth., Disp: 60 tablet, Rfl: 0 .  Multiple Vitamins-Minerals (EYE VITAMINS PO), Take by mouth 2 (two) times daily., Disp: , Rfl:  .  naloxone (NARCAN) nasal spray 4 mg/0.1 mL, Place 1 spray into the nose 3 times/day as needed-between meals & bedtime., Disp: , Rfl:  .  NEOMYCIN-POLYMYXIN-HYDROCORTISONE (CORTISPORIN) 1 % SOLN OTIC solution, , Disp: , Rfl:  .  [START ON 08/28/2017] oxyCODONE (OXY IR/ROXICODONE) 5 MG immediate release tablet, Take 1 tablet (5 mg total) daily for 7 days by mouth. Max: 1/day, Disp: 7 tablet, Rfl: 0 .  [START ON 09/11/2017] oxyCODONE (OXY IR/ROXICODONE) 5 MG immediate release tablet, Take 1 tablet (5 mg total) daily for 7 days by mouth. Max: 1/day, Disp: 7 tablet, Rfl: 0 .  [START ON 09/25/2017] oxyCODONE (OXY IR/ROXICODONE) 5 MG immediate release tablet, Take 1 tablet (5 mg total) daily for 7 days by mouth. Max: 1/day, Disp: 7 tablet, Rfl: 0 .  [START ON 11/06/2017] oxyCODONE (OXY IR/ROXICODONE) 5 MG immediate release tablet, Take 1 tablet (5 mg total) daily for 7 days by mouth. Max: 1/day, Disp: 7 tablet, Rfl: 0 .  [START ON 10/30/2017] oxyCODONE (OXY IR/ROXICODONE) 5 MG immediate release tablet, Take 1 tablet (5 mg total) 2 (two) times daily for 7 days by mouth. Max: 2/day, Disp: 14 tablet, Rfl: 0 .  [START ON  10/23/2017] oxyCODONE (OXY IR/ROXICODONE) 5 MG immediate release tablet, Take 1 tablet (5 mg total) 3 (three) times daily for 7 days by mouth. Max: 3/day, Disp: 21 tablet, Rfl: 0 .  [START ON 10/16/2017] oxyCODONE (OXY IR/ROXICODONE) 5 MG immediate release tablet, Take 1 tablet (5 mg total) 4 (four) times daily for 7 days by mouth. Max: 4/day, Disp: 28 tablet, Rfl: 0 .  [START ON 10/09/2017] oxyCODONE (OXY IR/ROXICODONE) 5 MG immediate release tablet, Take 1 tablet (5 mg total) 5 (five) times daily for 7 days by mouth. Max: 5/day, Disp: 35 tablet, Rfl: 0 .  [START ON 09/25/2017] Oxycodone HCl 10 MG TABS, Take 1 tablet (10 mg total) 3 (three) times daily for 14 days by mouth. Max: 3/day, Disp: 42 tablet, Rfl: 0 .  [START ON 09/11/2017] Oxycodone HCl 10 MG TABS, Take 1 tablet (10 mg total) 4 (four) times daily for 14 days by mouth. Max: 4/day, Disp: 56 tablet, Rfl: 0 .  [START ON 08/28/2017] Oxycodone HCl 10 MG TABS, Take 1 tablet (10 mg total) 5 (five) times daily for 14 days by mouth. Max: 5/day, Disp: 70 tablet, Rfl: 0 .  potassium chloride (K-DUR,KLOR-CON) 10 MEQ tablet, TAKE 2 TABLETS DAILY, Disp: , Rfl:  .  Testosterone (ANDROGEL PUMP) 20.25 MG/ACT (1.62%) GEL, 20.25 mg., Disp: , Rfl:  .  traZODone (DESYREL) 150 MG tablet, Take 150 mg by mouth every evening., Disp: , Rfl:  .  TURMERIC PO, Take daily at 6 (six) AM by mouth. , Disp: , Rfl:  .  varenicline (CHANTIX CONTINUING MONTH PAK) 1 MG tablet, Take 1 tablet (1 mg  total) by mouth 2 (two) times daily., Disp: 60 tablet, Rfl: 0 .  vitamin B-12 (CYANOCOBALAMIN) 100 MCG tablet, Take 100 mcg by mouth daily., Disp: , Rfl:  .  lubiprostone (AMITIZA) 24 MCG capsule, Take 1 capsule (24 mcg total) 2 (two) times daily with a meal by mouth. Swallow the medication whole. Do not break or chew the medication., Disp: 60 capsule, Rfl: 0 .  Wheat Dextrin (BENEFIBER) POWD, Stir 2 tsp. TID into 4-8 oz of any non-carbonated beverage or soft food (hot or cold), Disp: 500  g, Rfl: 0  ROS  Constitutional: Denies any fever or chills Gastrointestinal: No reported hemesis, hematochezia, vomiting, or acute GI distress Musculoskeletal: Denies any acute onset joint swelling, redness, loss of ROM, or weakness Neurological: No reported episodes of acute onset apraxia, aphasia, dysarthria, agnosia, amnesia, paralysis, loss of coordination, or loss of consciousness  Allergies  Ian Morse is allergic to lyrica [pregabalin].  [place]  Drug: Ian Morse  reports that he does not use drugs. Alcohol:  reports that he does not drink alcohol. Tobacco:  reports that  has never smoked. His smokeless tobacco use includes chew. Medical:  has a past medical history of Anxiety, Depression, Hypertension, MRSA carrier, Neuromuscular disorder ([place]), and Opioid-induced constipation (OIC) (04/06/2017). Surgical: Ian Morse  has a past surgical history that includes Spinal cord stimulator insertion and Surgery scrotal / testicular. Family: family history is not on file.  Constitutional Exam  General appearance: Well nourished, well developed, and well hydrated. In no apparent acute distress Vitals:   08/24/17 1001  BP: 128/61  Pulse: 64  Resp: 16  Temp: 98.7 F (37.1 C)  SpO2: 100%  Weight: 293 lb (132.9 kg)  Height: 5' 9"  (1.753 m)   BMI Assessment: Estimated body mass index is 43.27 kg/m as calculated from the following:   Height as of this encounter: 5' 9"  (1.753 m).   Weight as of this encounter: 293 lb (132.9 kg).  BMI interpretation table: BMI level Category Range association with higher incidence of chronic pain  <18 kg/m2 Underweight   18.5-24.9 kg/m2 Ideal body weight   25-29.9 kg/m2 Overweight Increased incidence by 20%  30-34.9 kg/m2 Obese (Class I) Increased incidence by 68%  35-39.9 kg/m2 Severe obesity (Class II) Increased incidence by 136%  >40 kg/m2 Extreme obesity (Class III) Increased incidence by 254%   BMI Readings from Last 4 Encounters:  08/24/17 43.27  kg/m  07/27/17 43.71 kg/m  06/23/17 44.01 kg/m  05/24/17 43.42 kg/m   Wt Readings from Last 4 Encounters:  08/24/17 293 lb (132.9 kg)  07/27/17 296 lb (134.3 kg)  06/23/17 298 lb (135.2 kg)  05/24/17 294 lb (133.4 kg)  Psych/Mental status: Alert, oriented x 3 (person, place, & time)       Eyes: PERLA Respiratory: No evidence of acute respiratory distress  Cervical Spine Area Exam  Skin & Axial Inspection: No masses, redness, edema, swelling, or associated skin lesions Alignment: Symmetrical Functional ROM: Unrestricted ROM      Stability: No instability detected Muscle Tone/Strength: Functionally intact. No obvious neuro-muscular anomalies detected. Sensory (Neurological): Unimpaired Palpation: No palpable anomalies              Upper Extremity (UE) Exam    Side: Right upper extremity  Side: Left upper extremity  Skin & Extremity Inspection: Skin color, temperature, and hair growth are WNL. No peripheral edema or cyanosis. No masses, redness, swelling, asymmetry, or associated skin lesions. No contractures.  Skin & Extremity Inspection: Skin color,  temperature, and hair growth are WNL. No peripheral edema or cyanosis. No masses, redness, swelling, asymmetry, or associated skin lesions. No contractures.  Functional ROM: Unrestricted ROM          Functional ROM: Unrestricted ROM          Muscle Tone/Strength: Functionally intact. No obvious neuro-muscular anomalies detected.  Muscle Tone/Strength: Functionally intact. No obvious neuro-muscular anomalies detected.  Sensory (Neurological): Unimpaired          Sensory (Neurological): Unimpaired          Palpation: No palpable anomalies              Palpation: No palpable anomalies              Specialized Test(s): Deferred         Specialized Test(s): Deferred          Thoracic Spine Area Exam  Skin & Axial Inspection: No masses, redness, or swelling Alignment: Symmetrical Functional ROM: Unrestricted ROM Stability: No instability  detected Muscle Tone/Strength: Functionally intact. No obvious neuro-muscular anomalies detected. Sensory (Neurological): Unimpaired Muscle strength & Tone: No palpable anomalies  Lumbar Spine Area Exam  Skin & Axial Inspection: No masses, redness, or swelling Alignment: Symmetrical Functional ROM: Unrestricted ROM      Stability: No instability detected Muscle Tone/Strength: Functionally intact. No obvious neuro-muscular anomalies detected. Sensory (Neurological): Unimpaired Palpation: No palpable anomalies       Provocative Tests: Lumbar Hyperextension and rotation test: evaluation deferred today       Lumbar Lateral bending test: evaluation deferred today       Patrick's Maneuver: evaluation deferred today                    Gait & Posture Assessment  Ambulation: Unassisted Gait: Relatively normal for age and body habitus Posture: WNL   Lower Extremity Exam    Side: Right lower extremity  Side: Left lower extremity  Skin & Extremity Inspection: Skin color, temperature, and hair growth are WNL. No peripheral edema or cyanosis. No masses, redness, swelling, asymmetry, or associated skin lesions. No contractures.  Skin & Extremity Inspection: Skin color, temperature, and hair growth are WNL. No peripheral edema or cyanosis. No masses, redness, swelling, asymmetry, or associated skin lesions. No contractures.  Functional ROM: Unrestricted ROM          Functional ROM: Unrestricted ROM          Muscle Tone/Strength: Functionally intact. No obvious neuro-muscular anomalies detected.  Muscle Tone/Strength: Functionally intact. No obvious neuro-muscular anomalies detected.  Sensory (Neurological): Unimpaired  Sensory (Neurological): Unimpaired  Palpation: No palpable anomalies  Palpation: No palpable anomalies   Assessment  Primary Diagnosis & Pertinent Problem List: The primary encounter diagnosis was Chronic pain syndrome. 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 were also pertinent to this visit.  Status Diagnosis  Controlled Controlled Controlled 1. Chronic pain syndrome   2. Chronic shoulder pain (Right)   3. Chronic upper extremity pain (Primary Source of Pain) (Left)   4. Long term prescription opiate use   5. Opioid-induced constipation (OIC)   6. 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 DDD (degenerative disc disease), cervical   8. 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  Problems updated and reviewed during this visit: No problems updated. Plan of Care  Pharmacotherapy (Medications Ordered): Meds ordered this encounter  Medications  . oxyCODONE (OXY IR/ROXICODONE) 5 MG immediate release tablet    Sig: Take 1 tablet (5 mg total) daily for 7 days by mouth. Max: 1/day    Dispense:  7 tablet    Refill:  0    This prescription is part of a downward opioid taper. Fill instructions must be followed exactly as written to avoid withdrawal. Fill date: 08/28/17 To last until: 09/04/17  . Oxycodone HCl 10 MG TABS    Sig: Take 1 tablet (10 mg total) 3 (three) times daily for 14 days by mouth. Max: 3/day    Dispense:  42 tablet    Refill:  0    This prescription is part of a downward opioid taper. Fill instructions must be followed exactly as written to avoid withdrawal. Fill date: 09/25/17 To last until: 10/09/17  . Oxycodone HCl 10 MG TABS    Sig: Take 1 tablet (10 mg total) 4 (four) times daily for 14 days by mouth. Max: 4/day    Dispense:  56 tablet    Refill:  0    This prescription is part of a downward opioid taper. Fill instructions must be followed exactly as  written to avoid withdrawal. Fill date: 09/11/17 To last until: 09/25/17  . Oxycodone HCl 10 MG TABS    Sig: Take 1 tablet (10 mg total) 5 (five) times daily for 14 days by mouth. Max: 5/day    Dispense:  70 tablet    Refill:  0    This prescription is part of a downward opioid taper. Fill instructions must be followed exactly as written to avoid withdrawal. Fill date: 08/28/17 To last until: 09/11/17  . oxyCODONE (OXY IR/ROXICODONE) 5 MG immediate release tablet    Sig: Take 1 tablet (5 mg total) daily for 7 days by mouth. Max: 1/day    Dispense:  7 tablet    Refill:  0    This prescription is part of a downward opioid taper. Fill instructions must be followed exactly as written to avoid withdrawal. Fill date: 09/11/17 To last until: 09/18/17  . oxyCODONE (OXY IR/ROXICODONE) 5 MG immediate release tablet    Sig: Take 1 tablet (5 mg total) daily for 7 days by mouth. Max: 1/day    Dispense:  7 tablet    Refill:  0    This prescription is part of a downward opioid taper. Fill instructions must be followed exactly as written to avoid withdrawal. Fill date: 09/25/17 To last until: 10/02/17  . oxyCODONE (OXY IR/ROXICODONE) 5 MG immediate release tablet    Sig: Take 1 tablet (5 mg total) daily for 7 days by mouth. Max: 1/day    Dispense:  7 tablet    Refill:  0    This prescription is part of a downward opioid taper. Fill instructions must be followed exactly as written to avoid withdrawal. Fill date: 11/06/17 To last until: 11/13/17  . oxyCODONE (OXY IR/ROXICODONE) 5 MG immediate release tablet    Sig: Take 1 tablet (5 mg total) 2 (two) times daily for 7 days by mouth. Max: 2/day    Dispense:  14 tablet    Refill:  0    This prescription is part of a downward opioid taper. Fill instructions must be followed exactly as written to avoid withdrawal. Fill date: 10/30/17 To last until: 11/06/17  . oxyCODONE (OXY IR/ROXICODONE)  5 MG immediate release tablet    Sig: Take 1 tablet (5 mg total) 3  (three) times daily for 7 days by mouth. Max: 3/day    Dispense:  21 tablet    Refill:  0    This prescription is part of a downward opioid taper. Fill instructions must be followed exactly as written to avoid withdrawal. Fill date: 10/23/17 To last until: 10/30/17  . oxyCODONE (OXY IR/ROXICODONE) 5 MG immediate release tablet    Sig: Take 1 tablet (5 mg total) 4 (four) times daily for 7 days by mouth. Max: 4/day    Dispense:  28 tablet    Refill:  0    This prescription is part of a downward opioid taper. Fill instructions must be followed exactly as written to avoid withdrawal. Fill date: 10/16/17 To last until: 10/23/17  . oxyCODONE (OXY IR/ROXICODONE) 5 MG immediate release tablet    Sig: Take 1 tablet (5 mg total) 5 (five) times daily for 7 days by mouth. Max: 5/day    Dispense:  35 tablet    Refill:  0    This prescription is part of a downward opioid taper. Fill instructions must be followed exactly as written to avoid withdrawal. Fill date: 10/09/17 To last until: 10/16/17  . morphine (MS CONTIN) 30 MG 12 hr tablet    Sig: Take 1 tablet (30 mg total) 2 (two) times daily by mouth.    Dispense:  60 tablet    Refill:  0    Fill one day early if pharmacy is closed on scheduled refill date. Do not fill until: 08/28/17 To last until: 09/27/17  . morphine (MS CONTIN) 30 MG 12 hr tablet    Sig: Take 1 tablet (30 mg total) every 12 (twelve) hours by mouth.    Dispense:  60 tablet    Refill:  0    Do not place this medication, or any other prescription from our practice, on "Automatic Refill". Patient may have prescription filled one day early if pharmacy is closed on scheduled refill date. Do not fill until: 09/27/17 To last until: 10/27/17  . morphine (MS CONTIN) 30 MG 12 hr tablet    Sig: Take 1 tablet (30 mg total) every 12 (twelve) hours by mouth.    Dispense:  60 tablet    Refill:  0    Do not place this medication, or any other prescription from our practice, on "Automatic  Refill". Patient may have prescription filled one day early if pharmacy is closed on scheduled refill date. Do not fill until: 10/27/17 To last until: 11/26/17  . lubiprostone (AMITIZA) 24 MCG capsule    Sig: Take 1 capsule (24 mcg total) 2 (two) times daily with a meal by mouth. Swallow the medication whole. Do not break or chew the medication.    Dispense:  60 capsule    Refill:  0    Do not place this medication, or any other prescription from our practice, on "Automatic Refill". Patient may have prescription filled one day early if pharmacy is closed on scheduled refill date.  . Wheat Dextrin (BENEFIBER) POWD    Sig: Stir 2 tsp. TID into 4-8 oz of any non-carbonated beverage or soft food (hot or cold)    Dispense:  500 g    Refill:  0    This is an OTC product. This prescription is to serve as a reminder to the patient as to our preference.  This SmartLink is deprecated. Use AVSMEDLIST  instead to display the medication list for a patient. Medications administered today: George L. Elko had no medications administered during this visit.   Procedure Orders     SUPRASCAPULAR NERVE BLOCK Lab Orders  No laboratory test(s) ordered today   Imaging Orders  No imaging studies ordered today   Referral Orders  No referral(s) requested today    Interventional management options: Planned, scheduled, and/or pending:   Stimulator working OK and taking care of arm pain. He will be tapering his opioids down over the next couple of months at a rate of 5 mg (oxycodone) per week.   Considering:   Diagnostic right intra-articular shoulder joint injection #2  Diagnostic right suprascapular nerve block   Possible right suprascapular nerve RFA   Diagnostic left cervical epidural steroid injection Diagnostic bilateral cervical facet block Possible bilateral cervical facet RFA Diagnostic bilateral lumbar facet block Possible bilateral lumbar facet RFA   Palliative PRN treatment(s):    Palliative right intra-articular shoulder joint injection #2    Provider-requested follow-up: Return for Med-Mgmt w/ Dr. Dossie Arbour before 11/26/17..  Future Appointments  Date Time Provider [place]  11/07/2019  9:15 AM Milinda Pointer, [place] None   Primary Care Physician: Dellia Beckwith, PA-C Location: [facility] Note by: Gaspar Cola, MD Date: 08/24/2017; Time: 12:16 PM

## 2017-08-24 ENCOUNTER — Encounter: Payer: Self-pay | Admitting: Pain Medicine

## 2017-08-24 ENCOUNTER — Ambulatory Visit: Payer: BLUE CROSS/BLUE SHIELD | Attending: Pain Medicine | Admitting: Pain Medicine

## 2017-08-24 VITALS — BP 128/61 | HR 64 | Temp 98.7°F | Resp 16 | Ht 69.0 in | Wt 293.0 lb

## 2017-08-24 DIAGNOSIS — Z79891 Long term (current) use of opiate analgesic: Secondary | ICD-10-CM | POA: Diagnosis not present

## 2017-08-24 DIAGNOSIS — M19011 Primary osteoarthritis, right shoulder: Secondary | ICD-10-CM | POA: Diagnosis not present

## 2017-08-24 DIAGNOSIS — Z7951 Long term (current) use of inhaled steroids: Secondary | ICD-10-CM | POA: Insufficient documentation

## 2017-08-24 DIAGNOSIS — I10 Essential (primary) hypertension: Secondary | ICD-10-CM | POA: Insufficient documentation

## 2017-08-24 DIAGNOSIS — F419 Anxiety disorder, unspecified: Secondary | ICD-10-CM | POA: Insufficient documentation

## 2017-08-24 DIAGNOSIS — N50811 Right testicular pain: Secondary | ICD-10-CM | POA: Diagnosis not present

## 2017-08-24 DIAGNOSIS — N50812 Left testicular pain: Secondary | ICD-10-CM | POA: Insufficient documentation

## 2017-08-24 DIAGNOSIS — G90512 Complex regional pain syndrome I of left upper limb: Secondary | ICD-10-CM | POA: Insufficient documentation

## 2017-08-24 DIAGNOSIS — E291 Testicular hypofunction: Secondary | ICD-10-CM | POA: Diagnosis not present

## 2017-08-24 DIAGNOSIS — N529 Male erectile dysfunction, unspecified: Secondary | ICD-10-CM | POA: Diagnosis not present

## 2017-08-24 DIAGNOSIS — K5903 Drug induced constipation: Secondary | ICD-10-CM | POA: Insufficient documentation

## 2017-08-24 DIAGNOSIS — M4310 Spondylolisthesis, site unspecified: Secondary | ICD-10-CM

## 2017-08-24 DIAGNOSIS — G8929 Other chronic pain: Secondary | ICD-10-CM | POA: Diagnosis not present

## 2017-08-24 DIAGNOSIS — Z888 Allergy status to other drugs, medicaments and biological substances status: Secondary | ICD-10-CM | POA: Diagnosis not present

## 2017-08-24 DIAGNOSIS — M5030 Other cervical disc degeneration, unspecified cervical region: Secondary | ICD-10-CM | POA: Diagnosis not present

## 2017-08-24 DIAGNOSIS — Z79899 Other long term (current) drug therapy: Secondary | ICD-10-CM | POA: Insufficient documentation

## 2017-08-24 DIAGNOSIS — M79602 Pain in left arm: Secondary | ICD-10-CM | POA: Insufficient documentation

## 2017-08-24 DIAGNOSIS — G894 Chronic pain syndrome: Secondary | ICD-10-CM | POA: Diagnosis not present

## 2017-08-24 DIAGNOSIS — F329 Major depressive disorder, single episode, unspecified: Secondary | ICD-10-CM | POA: Insufficient documentation

## 2017-08-24 DIAGNOSIS — T402X5A Adverse effect of other opioids, initial encounter: Secondary | ICD-10-CM | POA: Diagnosis not present

## 2017-08-24 DIAGNOSIS — M5137 Other intervertebral disc degeneration, lumbosacral region: Secondary | ICD-10-CM | POA: Insufficient documentation

## 2017-08-24 DIAGNOSIS — M25511 Pain in right shoulder: Secondary | ICD-10-CM | POA: Insufficient documentation

## 2017-08-24 DIAGNOSIS — Z22322 Carrier or suspected carrier of Methicillin resistant Staphylococcus aureus: Secondary | ICD-10-CM | POA: Diagnosis not present

## 2017-08-24 DIAGNOSIS — G4700 Insomnia, unspecified: Secondary | ICD-10-CM | POA: Diagnosis not present

## 2017-08-24 DIAGNOSIS — M488X6 Other specified spondylopathies, lumbar region: Secondary | ICD-10-CM | POA: Insufficient documentation

## 2017-08-24 DIAGNOSIS — M4317 Spondylolisthesis, lumbosacral region: Secondary | ICD-10-CM | POA: Insufficient documentation

## 2017-08-24 MED ORDER — OXYCODONE HCL 5 MG PO TABS: 5.0000 mg | ORAL_TABLET | Freq: Two times a day (BID) | ORAL | 0 refills | Status: DC

## 2017-08-24 MED ORDER — OXYCODONE HCL 10 MG PO TABS: 10.0000 mg | ORAL_TABLET | Freq: Four times a day (QID) | ORAL | 0 refills | Status: DC

## 2017-08-24 MED ORDER — OXYCODONE HCL 10 MG PO TABS: 10.0000 mg | ORAL_TABLET | Freq: Every day | ORAL | 0 refills | Status: DC

## 2017-08-24 MED ORDER — OXYCODONE HCL 5 MG PO TABS: 5.0000 mg | ORAL_TABLET | Freq: Every day | ORAL | 0 refills | Status: DC

## 2017-08-24 MED ORDER — LUBIPROSTONE 24 MCG PO CAPS: 24.0000 ug | ORAL_CAPSULE | Freq: Two times a day (BID) | ORAL | 0 refills | Status: AC

## 2017-08-24 MED ORDER — OXYCODONE HCL 5 MG PO TABS: 5.0000 mg | ORAL_TABLET | Freq: Three times a day (TID) | ORAL | 0 refills | Status: DC

## 2017-08-24 MED ORDER — OXYCODONE HCL 5 MG PO TABS
5.0000 mg | ORAL_TABLET | Freq: Every day | ORAL | 0 refills | Status: DC
Start: 2017-08-28 — End: 2017-11-07

## 2017-08-24 MED ORDER — OXYCODONE HCL 5 MG PO TABS: 5.0000 mg | ORAL_TABLET | Freq: Four times a day (QID) | ORAL | 0 refills | Status: DC

## 2017-08-24 MED ORDER — MORPHINE SULFATE ER 30 MG PO TBCR: 30.0000 mg | EXTENDED_RELEASE_TABLET | Freq: Two times a day (BID) | ORAL | 0 refills | Status: DC

## 2017-08-24 MED ORDER — BENEFIBER PO POWD: ORAL | 0 refills | Status: AC

## 2017-08-24 MED ORDER — OXYCODONE HCL 5 MG PO TABS: 5.0000 mg | ORAL_TABLET | Freq: Every day | ORAL | 0 refills | Status: AC

## 2017-08-24 MED ORDER — OXYCODONE HCL 10 MG PO TABS: 10.0000 mg | ORAL_TABLET | Freq: Three times a day (TID) | ORAL | 0 refills | Status: DC

## 2017-08-24 MED ORDER — MORPHINE SULFATE ER 30 MG PO TBCR: 30.0000 mg | EXTENDED_RELEASE_TABLET | Freq: Two times a day (BID) | ORAL | 0 refills | Status: AC

## 2017-08-24 NOTE — Patient Instructions (Signed)

## 2017-08-24 NOTE — Progress Notes (Signed)
Nursing Pain Medication Assessment:  Safety precautions to be maintained throughout the outpatient stay will include: orient to surroundings, keep bed in low position, maintain call bell within reach at all times, provide assistance with transfer out of bed and ambulation.  Medication Inspection Compliance: Pill count conducted under aseptic conditions, in front of the patient. Neither the pills nor the bottle was removed from the patient's sight at any time. Once count was completed pills were immediately returned to the patient in their original bottle.  Medication #1: Morphine ER (MSContin) Pill/Patch Count: 9 of 60 pills remain Pill/Patch Appearance: Markings consistent with prescribed medication Bottle Appearance: Standard pharmacy container. Clearly labeled. Filled Date: 2410 / 12 / 2018 Last Medication intake:  Today  Medication #2: Oxycodone IR Pill/Patch Count: 83 of 120 pills remain Pill/Patch Appearance: Markings consistent with prescribed medication Bottle Appearance: Standard pharmacy container. Clearly labeled. Filled Date: 10/12/ 2018 Last Medication intake:  Today

## 2017-11-07 ENCOUNTER — Ambulatory Visit: Payer: Medicare Other | Admitting: Pain Medicine

## 2017-11-07 NOTE — Progress Notes (Deleted)
Patient's Name: Ian Morse  MRN: [id number]  Referring Provider: Dellia Beckwith, *  DOB: 01/07/1971  PCP: Dellia Beckwith, PA-C  DOS: 11/07/2017  Note by: Gaspar Cola, MD  Service setting: Ambulatory outpatient  Specialty: Interventional Pain Management  Location: [facility] (AMB) Pain Management Facility    Patient type: Established   Primary Reason(s) for Visit: Encounter for prescription drug management. (Level of risk: moderate)  CC: No chief complaint on file.  HPI  Ian Morse is a 47 y.o. year old, male patient, who comes today for a medication management evaluation. 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); Long term (current) use of opiate analgesic (nonstop since 04/11/2011); Long term prescription opiate use; Opiate use (150 MME/Day); Chronic upper extremity pain (Primary Source of Pain) (Left); Chronic neck pain (Secondary source of pain) (Bilateral) (L>R); Chronic testicular pain [facility] source of pain) (Bilateral) (L>R); Chronic low back pain (Bilateral) (L>R); Spinal cord stimulator status; Encounter for interrogation of neurostimulator; History of Chronic CRPS of upper extremity (Left); Lumbar facet syndrome (Bilateral) (L>R); Opioid-induced constipation (OIC); Long term prescription benzodiazepine use (since before 04/11/2011); Chronic shoulder pain (Right); Osteoarthritis of shoulder (Right); DDD (degenerative disc disease), cervical (worst at C6-7); 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 His primarily concern today is the No chief complaint on file.  Pain  Assessment: Location:     Radiating:   Onset:   Duration:   Quality:   Severity:  /10 (self-reported pain score)  Note: Reported level is compatible with observation.                         When using our objective Pain Scale, levels between 6 and 10/10 are said to belong in an emergency room, as it progressively worsens from a 6/10, described as severely limiting, requiring emergency care not usually available at an outpatient pain management facility. At a 6/10 level, communication becomes difficult and requires great effort. Assistance to reach the emergency department may be required. Facial flushing and profuse sweating along with potentially dangerous increases in heart rate and blood pressure will be evident. Effect on ADL:   Timing:   Modifying factors:    Ian Morse was last scheduled for an appointment on 08/24/2017 for medication management. During today's appointment we reviewed Ian Morse chronic pain status, as well as his outpatient medication regimen.  The patient comes into the clinic today for follow-up evaluation and medication management.  He is currently undergoing an opioid tapering for the purpose of accomplishing a "Drug Holiday".  Patient should be completely done with his opioid tapering by 01/21/2018 and with his drug holiday by 02/04/2018.  When the patient started.  Was taking morphine ER 30 mg 1 tablet by mouth twice a day (60 mg/day of morphine) + oxycodone IR 15 mg 1 tablet by mouth every 6 hours (60 mg/day of oxycodone) for a total of 150 MME/Day.  He returns to the clinic today, currently taking morphine ER 30 mg 1 tablet by mouth twice a day (60 mg/day of morphine) + oxycodone IR 5 mg 1 tablet by mouth qd (5 mg/day of oxycodone) for a total of 67.5 MME/Day.  On 11/26/2017 he  will be done with his current supply of morphine ER 30 mg 1 tablet p.o. twice daily.  At this point, we will be converting his morphine to oxycodone IR to facilitate this tapering.  He will start taking  oxycodone IR 5 mg 2 tablets every 6 hours (40 mg/day of oxycodone) and we will continue decreasing his opioid dose by 5 mg of oxycodone every 7 days until he is completely off of the opioids.  He should be done with this taper by 01/21/2018.  After he completes the taper, then he should be spending at least 2 weeks without any opioids for the purpose of the "Drug Holiday".  Once he completes the drug holiday, he will be given the choice of going back on the pain medication or discontinuing it permanently.  The patient  reports that he does not use drugs. His body mass index is unknown because there is no height or weight on file.  Further details on both, my assessment(s), as well as the proposed treatment plan, please see below.  Controlled Substance Pharmacotherapy Assessment REMS (Risk Evaluation and Mitigation Strategy)  Analgesic: morphine ER 30 mg 1 tablet by mouth twice a day (60 mg/day of morphine) + oxycodone IR 5 mg 1 tablet by mouth qd (5 mg/day of oxycodone) for a total of 67.5 MME/Day. MME/day: 67.5 mg/day.  No notes on file Pharmacokinetics: Liberation and absorption (onset of action): WNL Distribution (time to peak effect): WNL Metabolism and excretion (duration of action): WNL         Pharmacodynamics: Desired effects: Analgesia: Ian Morse reports >50% benefit. Functional ability: Patient reports that medication allows him to accomplish basic ADLs Clinically meaningful improvement in function (CMIF): Sustained CMIF goals met Perceived effectiveness: Described as relatively effective, allowing for increase in activities of daily living (ADL) Undesirable effects: Side-effects or Adverse reactions: None reported Monitoring: [place] PMP: Online review of the past 60-monthperiod conducted. Compliant with practice rules and regulations Last UDS on record: Summary  Date Value Ref Range Status  04/05/2017 FINAL  Final    Comment:     ==================================================================== TOXAS[facility]RE COMP DRUG ANALYSIS,UR ==================================================================== Test                             Result       Flag       Units Drug Present and Declared for Prescription Verification   Morphine                       >8197        EXPECTED   ng/mg creat    Potential sources of large amounts of morphine in the absence of    codeine include administration of morphine or use of heroin.   Oxycodone                      730          EXPECTED   ng/mg creat   Oxymorphone                    330          EXPECTED   ng/mg creat   Noroxycodone                   4178         EXPECTED   ng/mg creat   Noroxymorphone  179          EXPECTED   ng/mg creat    Sources of oxycodone are scheduled prescription medications.    Oxymorphone, noroxycodone, and noroxymorphone are expected    metabolites of oxycodone. Oxymorphone is also available as a    scheduled prescription medication.   Gabapentin                     PRESENT      EXPECTED   Trazodone                      PRESENT      EXPECTED   1,3 chlorophenyl piperazine    PRESENT      EXPECTED    1,3-chlorophenyl piperazine is an expected metabolite of    trazodone.   Acetaminophen                  PRESENT      EXPECTED Drug Present not Declared for Prescription Verification   Salicylate                     PRESENT      UNEXPECTED Drug Absent but Declared for Prescription Verification   Butalbital                     Not Detected UNEXPECTED   Duloxetine                     Not Detected UNEXPECTED   Fluoxetine                     Not Detected UNEXPECTED ==================================================================== Test                      Result    Flag   Units      Ref Range   Creatinine              122              mg/dL      >=20 ==================================================================== Declared Medications:  The  flagging and interpretation on this report are based on the  following declared medications.  Unexpected results may arise from  inaccuracies in the declared medications.  **Note: The testing scope of this panel includes these medications:  Butalbital (Butalbital/APAP/Caffeine)  Duloxetine  Fluoxetine  Gabapentin  Morphine (Morphine Sulfate)  Oxycodone  Trazodone  **Note: The testing scope of this panel does not include small to  moderate amounts of these reported medications:  Acetaminophen (Butalbital/APAP/Caffeine)  **Note: The testing scope of this panel does not include following  reported medications:  Albuterol  Caffeine (Butalbital/APAP/Caffeine)  Calcium Carbonate (Calcium Carb/Vitamin D)  Cyanocobalamin  Fluticasone  Herbal Product  Hydrochlorothiazide (Lisinopril-HCTZ)  Lactulose  Lisinopril (Lisinopril-HCTZ)  Magnesium (Mag)  Multivitamin (MVI)  Potassium  Supplement (Krill Oil)  Testosterone  Turmeric  Vitamin D (Calcium Carb/Vitamin D) ==================================================================== For clinical consultation, please call [phone]. ====================================================================    UDS interpretation: Compliant          Medication Assessment Form: Reviewed. Patient indicates being compliant with therapy Treatment compliance: Compliant Risk Assessment Profile: Aberrant behavior: See prior evaluations. None observed or detected today Comorbid factors increasing risk of overdose: See prior notes. No additional risks detected today Risk of substance use disorder (SUD): Low  ORT Scoring interpretation table:  Score <3 = Low Risk for SUD  Score between 4-7 =  Moderate Risk for SUD  Score >8 = High Risk for Opioid Abuse   Risk Mitigation Strategies:  Patient Counseling: Covered Patient-Prescriber Agreement (PPA): Present and active  Notification to other healthcare providers: Done  Pharmacologic Plan: No  change in therapy, at this time.             Laboratory Chemistry  Inflammation Markers (CRP: Acute Phase) (ESR: Chronic Phase) Lab Results  Component Value Date   CRP 5.7 (H) 04/05/2017   ESRSEDRATE 10 04/05/2017                 Renal Function Markers Lab Results  Component Value Date   BUN 15 04/05/2017   CREATININE 0.68 (L) 04/05/2017   GFRAA 131 04/05/2017   GFRNONAA 114 04/05/2017                 Hepatic Function Markers Lab Results  Component Value Date   AST 16 04/05/2017   ALT 24 04/05/2017   ALBUMIN 4.5 04/05/2017   ALKPHOS 82 04/05/2017                 Electrolytes Lab Results  Component Value Date   NA 144 04/05/2017   K 4.1 04/05/2017   CL 102 04/05/2017   CALCIUM 9.5 04/05/2017   MG 2.1 04/05/2017                 Neuropathy Markers Lab Results  Component Value Date   VITAMINB12 438 04/05/2017                 Bone Pathology Markers Lab Results  Component Value Date   25OHVITD1 38 04/05/2017   25OHVITD2 8.1 04/05/2017   25OHVITD3 30 04/05/2017                 Coagulation Parameters Lab Results  Component Value Date   PLT 316 08/21/2014                 Cardiovascular Markers Lab Results  Component Value Date   HGB 12.4 (L) 08/21/2014   HCT 37.8 (L) 08/21/2014                 Note: Lab results reviewed.  Recent Diagnostic Imaging Results  DG Lumbar Spine Complete W/Bend CLINICAL DATA:  Low back pain  EXAM: LUMBAR SPINE - COMPLETE WITH BENDING VIEWS  COMPARISON:  CT 05/18/2006  FINDINGS: Lumbosacral transitional vertebra with S1-S2 disc demonstrating grade 1 anterolisthesis of up to 6 mm involving S1 on S2 on the neutral and flexion lateral projections which reduces upon extension. No spondylolysis is identified. Findings are likely on the basis of degenerative facet arthropathy which are noted at L4-5 and L5-S1. A neurostimulator generator projects over the posterior elements at L4 and L5. No acute fracture nor suspicious  osseous lesions.  IMPRESSION: 1. Lumbosacral transitional vertebra with S1-S2 disc. 2. Grade 1 anterolisthesis of S1 on S2 in the neutral and flexion lateral views which appears to reduce upon extension.  Electronically Signed   By: Ashley Royalty M.D.   On: 04/20/2017 13:55 DG Cervical Spine Complete CLINICAL DATA:  Neck pain  EXAM: CERVICAL SPINE - COMPLETE 4+ VIEW  COMPARISON:  None.  FINDINGS: There is no evidence of cervical spine fracture or prevertebral soft tissue swelling. The atlantodental interval is within normal limits. No splaying of the lateral masses of C1 on C2. The odontoid process appears intact. Neural stimulator lead projects up to the C4-5 disc level within the central canal  posteriorly. Normal cervical lordosis. Slight disc space narrowing anteriorly at C6-7. Facet joints are maintained. No other significant bone abnormalities are identified.  IMPRESSION: 1. Slight disc space narrowing at C6-7. 2. Neural stimulator lead projects up to the C4-5 disc level. 3. No acute osseous abnormality.  Electronically Signed   By: Ashley Royalty M.D.   On: 04/20/2017 13:46  Complexity Note: Imaging results reviewed. Results shared with Ian Morse, using Layman's terms.                         Meds   Current Outpatient Medications:  .  albuterol (PROAIR HFA) 108 (90 Base) MCG/ACT inhaler, Inhale 2 puffs into the lungs as needed., Disp: , Rfl:  .  butalbital-acetaminophen-caffeine (FIORICET, ESGIC) 50-325-40 MG tablet, Take 1 tablet by mouth every 4 (four) hours as needed for headache., Disp: , Rfl:  .  DULoxetine (CYMBALTA) 60 MG capsule, Take 60 mg by mouth 2 (two) times daily., Disp: , Rfl:  .  fluticasone (FLONASE) 50 MCG/ACT nasal spray, Place 2 sprays into the nose as needed., Disp: , Rfl:  .  gabapentin (NEURONTIN) 800 MG tablet, Take 800 mg by mouth 4 (four) times daily. , Disp: , Rfl:  .  Ginkgo Biloba (GNP GINGKO BILOBA EXTRACT PO), Take daily at 6 (six) AM  by mouth. , Disp: , Rfl:  .  lactulose (CHRONULAC) 10 GM/15ML solution, Take 15-30 mLs by mouth once., Disp: , Rfl:  .  lisinopril-hydrochlorothiazide (PRINZIDE,ZESTORETIC) 10-12.5 MG tablet, Take 1 tablet by mouth daily., Disp: , Rfl:  .  morphine (MS CONTIN) 30 MG 12 hr tablet, Take 1 tablet (30 mg total) every 12 (twelve) hours by mouth., Disp: 60 tablet, Rfl: 0 .  Multiple Vitamins-Minerals (EYE VITAMINS PO), Take by mouth 2 (two) times daily., Disp: , Rfl:  .  naloxone (NARCAN) nasal spray 4 mg/0.1 mL, Place 1 spray into the nose 3 times/day as needed-between meals & bedtime., Disp: , Rfl:  .  NEOMYCIN-POLYMYXIN-HYDROCORTISONE (CORTISPORIN) 1 % SOLN OTIC solution, , Disp: , Rfl:  .  oxyCODONE (OXY IR/ROXICODONE) 5 MG immediate release tablet, Take 1 tablet (5 mg total) daily for 7 days by mouth. Max: 1/day, Disp: 7 tablet, Rfl: 0 .  potassium chloride (K-DUR,KLOR-CON) 10 MEQ tablet, TAKE 2 TABLETS DAILY, Disp: , Rfl:  .  Testosterone (ANDROGEL PUMP) 20.25 MG/ACT (1.62%) GEL, 20.25 mg., Disp: , Rfl:  .  traZODone (DESYREL) 150 MG tablet, Take 150 mg by mouth every evening., Disp: , Rfl:  .  TURMERIC PO, Take daily at 6 (six) AM by mouth. , Disp: , Rfl:  .  varenicline (CHANTIX CONTINUING MONTH PAK) 1 MG tablet, Take 1 tablet (1 mg total) by mouth 2 (two) times daily., Disp: 60 tablet, Rfl: 0 .  vitamin B-12 (CYANOCOBALAMIN) 100 MCG tablet, Take 100 mcg by mouth daily., Disp: , Rfl:   ROS  Constitutional: Denies any fever or chills Gastrointestinal: No reported hemesis, hematochezia, vomiting, or acute GI distress Musculoskeletal: Denies any acute onset joint swelling, redness, loss of ROM, or weakness Neurological: No reported episodes of acute onset apraxia, aphasia, dysarthria, agnosia, amnesia, paralysis, loss of coordination, or loss of consciousness  Allergies  Ian Morse is allergic to lyrica [pregabalin].  [place]  Drug: Ian Morse  reports that he does not use drugs. Alcohol:   reports that he does not drink alcohol. Tobacco:  reports that  has never smoked. His smokeless tobacco use includes chew. Medical:  has  a past medical history of Anxiety, Depression, Hypertension, MRSA carrier, Neuromuscular disorder ([place]), and Opioid-induced constipation (OIC) (04/06/2017). Surgical: Ian Morse  has a past surgical history that includes Spinal cord stimulator insertion and Surgery scrotal / testicular. Family: family history is not on file.  Constitutional Exam  General appearance: Well nourished, well developed, and well hydrated. In no apparent acute distress There were no vitals filed for this visit. BMI Assessment: Estimated body mass index is 43.27 kg/m as calculated from the following:   Height as of 08/24/17: 5' 9"  (1.753 m).   Weight as of 08/24/17: 293 lb (132.9 kg).  BMI interpretation table: BMI level Category Range association with higher incidence of chronic pain  <18 kg/m2 Underweight   18.5-24.9 kg/m2 Ideal body weight   25-29.9 kg/m2 Overweight Increased incidence by 20%  30-34.9 kg/m2 Obese (Class I) Increased incidence by 68%  35-39.9 kg/m2 Severe obesity (Class II) Increased incidence by 136%  >40 kg/m2 Extreme obesity (Class III) Increased incidence by 254%   BMI Readings from Last 4 Encounters:  08/24/17 43.27 kg/m  07/27/17 43.71 kg/m  06/23/17 44.01 kg/m  05/24/17 43.42 kg/m   Wt Readings from Last 4 Encounters:  08/24/17 293 lb (132.9 kg)  07/27/17 296 lb (134.3 kg)  06/23/17 298 lb (135.2 kg)  05/24/17 294 lb (133.4 kg)  Psych/Mental status: Alert, oriented x 3 (person, place, & time)       Eyes: PERLA Respiratory: No evidence of acute respiratory distress  Cervical Spine Area Exam  Skin & Axial Inspection: No masses, redness, edema, swelling, or associated skin lesions Alignment: Symmetrical Functional ROM: Unrestricted ROM      Stability: No instability detected Muscle Tone/Strength: Functionally intact. No obvious  neuro-muscular anomalies detected. Sensory (Neurological): Unimpaired Palpation: No palpable anomalies              Upper Extremity (UE) Exam    Side: Right upper extremity  Side: Left upper extremity  Skin & Extremity Inspection: Skin color, temperature, and hair growth are WNL. No peripheral edema or cyanosis. No masses, redness, swelling, asymmetry, or associated skin lesions. No contractures.  Skin & Extremity Inspection: Skin color, temperature, and hair growth are WNL. No peripheral edema or cyanosis. No masses, redness, swelling, asymmetry, or associated skin lesions. No contractures.  Functional ROM: Unrestricted ROM          Functional ROM: Unrestricted ROM          Muscle Tone/Strength: Functionally intact. No obvious neuro-muscular anomalies detected.  Muscle Tone/Strength: Functionally intact. No obvious neuro-muscular anomalies detected.  Sensory (Neurological): Unimpaired          Sensory (Neurological): Unimpaired          Palpation: No palpable anomalies              Palpation: No palpable anomalies              Specialized Test(s): Deferred         Specialized Test(s): Deferred          Thoracic Spine Area Exam  Skin & Axial Inspection: No masses, redness, or swelling Alignment: Symmetrical Functional ROM: Unrestricted ROM Stability: No instability detected Muscle Tone/Strength: Functionally intact. No obvious neuro-muscular anomalies detected. Sensory (Neurological): Unimpaired Muscle strength & Tone: No palpable anomalies  Lumbar Spine Area Exam  Skin & Axial Inspection: No masses, redness, or swelling Alignment: Symmetrical Functional ROM: Unrestricted ROM      Stability: No instability detected Muscle Tone/Strength: Functionally intact. No obvious neuro-muscular  anomalies detected. Sensory (Neurological): Unimpaired Palpation: No palpable anomalies       Provocative Tests: Lumbar Hyperextension and rotation test: evaluation deferred today       Lumbar Lateral  bending test: evaluation deferred today       Patrick's Maneuver: evaluation deferred today                    Gait & Posture Assessment  Ambulation: Unassisted Gait: Relatively normal for age and body habitus Posture: WNL   Lower Extremity Exam    Side: Right lower extremity  Side: Left lower extremity  Skin & Extremity Inspection: Skin color, temperature, and hair growth are WNL. No peripheral edema or cyanosis. No masses, redness, swelling, asymmetry, or associated skin lesions. No contractures.  Skin & Extremity Inspection: Skin color, temperature, and hair growth are WNL. No peripheral edema or cyanosis. No masses, redness, swelling, asymmetry, or associated skin lesions. No contractures.  Functional ROM: Unrestricted ROM          Functional ROM: Unrestricted ROM          Muscle Tone/Strength: Functionally intact. No obvious neuro-muscular anomalies detected.  Muscle Tone/Strength: Functionally intact. No obvious neuro-muscular anomalies detected.  Sensory (Neurological): Unimpaired  Sensory (Neurological): Unimpaired  Palpation: No palpable anomalies  Palpation: No palpable anomalies   Assessment  Primary Diagnosis & Pertinent Problem List: The primary encounter diagnosis was Chronic pain syndrome. Diagnoses of Chronic upper extremity pain (Primary Source of Pain) (Left), Chronic neck pain (Secondary source of pain) (Bilateral) (L>R), and Chronic testicular pain (Tertiary source of pain) (Bilateral) (L>R) were also pertinent to this visit.  Status Diagnosis  Controlled Controlled Controlled 1. Chronic pain syndrome   2. Chronic upper extremity pain (Primary Source of Pain) (Left)   3. Chronic neck pain (Secondary source of pain) (Bilateral) (L>R)   4. Chronic testicular pain [facility] source of pain) (Bilateral) (L>R)     Problems updated and reviewed during this visit: No problems updated. Plan of Care  Pharmacotherapy (Medications Ordered): No orders of the defined types were  placed in this encounter.  Medications administered today: Ian Morse had no medications administered during this visit.  Procedure Orders    No procedure(s) ordered today   Lab Orders  No laboratory test(s) ordered today   Imaging Orders  No imaging studies ordered today   Referral Orders  No referral(s) requested today    Interventional management options: Planned, scheduled, and/or pending:   Stimulator working OK and taking care of arm pain. He will be tapering his opioids down over the next couple of months at a rate of 5 mg (oxycodone) per week.   Considering:   Diagnostic right intra-articular shoulder joint injection #2  Diagnostic rightsuprascapular nerve block Possiblerightsuprascapular nerve RFA Diagnostic left cervical epidural steroid injection Diagnostic bilateral cervical facet block Possible bilateral cervical facet RFA Diagnostic bilateral lumbar facet block Possible bilateral lumbar facet RFA   Palliative PRN treatment(s):   Palliative right intra-articular shoulder joint injection #2    Provider-requested follow-up: No Follow-up on file.  Future Appointments  Date Time Provider [place]  11/07/2017 11:30 AM Milinda Pointer, [place] None   Primary Care Physician: Dellia Beckwith, PA-C Location: [facility] Note by: Gaspar Cola, MD Date: 11/07/2017; Time: 7:26 AM

## 2017-11-14 ENCOUNTER — Other Ambulatory Visit: Payer: Self-pay

## 2017-11-14 ENCOUNTER — Ambulatory Visit: Payer: Medicare PPO | Attending: Pain Medicine | Admitting: Pain Medicine

## 2017-11-14 ENCOUNTER — Encounter: Payer: Self-pay | Admitting: Pain Medicine

## 2017-11-14 VITALS — BP 132/64 | HR 72 | Temp 98.7°F | Resp 16 | Ht 69.0 in | Wt 294.0 lb

## 2017-11-14 DIAGNOSIS — F419 Anxiety disorder, unspecified: Secondary | ICD-10-CM | POA: Insufficient documentation

## 2017-11-14 DIAGNOSIS — K5900 Constipation, unspecified: Secondary | ICD-10-CM | POA: Diagnosis not present

## 2017-11-14 DIAGNOSIS — G90519 Complex regional pain syndrome I of unspecified upper limb: Secondary | ICD-10-CM | POA: Diagnosis not present

## 2017-11-14 DIAGNOSIS — M25511 Pain in right shoulder: Secondary | ICD-10-CM | POA: Insufficient documentation

## 2017-11-14 DIAGNOSIS — J0100 Acute maxillary sinusitis, unspecified: Secondary | ICD-10-CM | POA: Diagnosis not present

## 2017-11-14 DIAGNOSIS — K5903 Drug induced constipation: Secondary | ICD-10-CM

## 2017-11-14 DIAGNOSIS — N50811 Right testicular pain: Secondary | ICD-10-CM | POA: Insufficient documentation

## 2017-11-14 DIAGNOSIS — M542 Cervicalgia: Secondary | ICD-10-CM | POA: Diagnosis not present

## 2017-11-14 DIAGNOSIS — G90512 Complex regional pain syndrome I of left upper limb: Secondary | ICD-10-CM

## 2017-11-14 DIAGNOSIS — G8929 Other chronic pain: Secondary | ICD-10-CM | POA: Diagnosis not present

## 2017-11-14 DIAGNOSIS — I10 Essential (primary) hypertension: Secondary | ICD-10-CM | POA: Diagnosis not present

## 2017-11-14 DIAGNOSIS — G894 Chronic pain syndrome: Secondary | ICD-10-CM | POA: Insufficient documentation

## 2017-11-14 DIAGNOSIS — G4700 Insomnia, unspecified: Secondary | ICD-10-CM | POA: Insufficient documentation

## 2017-11-14 DIAGNOSIS — Z79899 Other long term (current) drug therapy: Secondary | ICD-10-CM | POA: Diagnosis not present

## 2017-11-14 DIAGNOSIS — D649 Anemia, unspecified: Secondary | ICD-10-CM | POA: Insufficient documentation

## 2017-11-14 DIAGNOSIS — M545 Low back pain: Secondary | ICD-10-CM | POA: Insufficient documentation

## 2017-11-14 DIAGNOSIS — F1390 Sedative, hypnotic, or anxiolytic use, unspecified, uncomplicated: Secondary | ICD-10-CM | POA: Diagnosis not present

## 2017-11-14 DIAGNOSIS — E291 Testicular hypofunction: Secondary | ICD-10-CM | POA: Diagnosis not present

## 2017-11-14 DIAGNOSIS — N529 Male erectile dysfunction, unspecified: Secondary | ICD-10-CM | POA: Diagnosis not present

## 2017-11-14 DIAGNOSIS — M50323 Other cervical disc degeneration at C6-C7 level: Secondary | ICD-10-CM | POA: Insufficient documentation

## 2017-11-14 DIAGNOSIS — M79642 Pain in left hand: Secondary | ICD-10-CM | POA: Insufficient documentation

## 2017-11-14 DIAGNOSIS — F1190 Opioid use, unspecified, uncomplicated: Secondary | ICD-10-CM

## 2017-11-14 DIAGNOSIS — M79602 Pain in left arm: Secondary | ICD-10-CM

## 2017-11-14 DIAGNOSIS — N50812 Left testicular pain: Secondary | ICD-10-CM | POA: Diagnosis not present

## 2017-11-14 DIAGNOSIS — Z79891 Long term (current) use of opiate analgesic: Secondary | ICD-10-CM | POA: Diagnosis not present

## 2017-11-14 DIAGNOSIS — F329 Major depressive disorder, single episode, unspecified: Secondary | ICD-10-CM | POA: Insufficient documentation

## 2017-11-14 DIAGNOSIS — T402X5A Adverse effect of other opioids, initial encounter: Secondary | ICD-10-CM | POA: Insufficient documentation

## 2017-11-14 DIAGNOSIS — M19011 Primary osteoarthritis, right shoulder: Secondary | ICD-10-CM | POA: Insufficient documentation

## 2017-11-14 MED ORDER — OXYCODONE HCL 5 MG PO TABS: 5.0000 mg | ORAL_TABLET | Freq: Every day | ORAL | 0 refills | Status: AC

## 2017-11-14 MED ORDER — OXYCODONE HCL 5 MG PO TABS: 5.0000 mg | ORAL_TABLET | Freq: Four times a day (QID) | ORAL | 0 refills | Status: AC | PRN

## 2017-11-14 MED ORDER — OXYCODONE HCL 5 MG PO TABS: 5.0000 mg | ORAL_TABLET | Freq: Three times a day (TID) | ORAL | 0 refills | Status: AC

## 2017-11-14 MED ORDER — OXYCODONE HCL 5 MG PO TABS: 5.0000 mg | ORAL_TABLET | Freq: Two times a day (BID) | ORAL | 0 refills | Status: AC

## 2017-11-14 MED ORDER — OXYCODONE HCL 5 MG PO TABS: 5.0000 mg | ORAL_TABLET | Freq: Four times a day (QID) | ORAL | 0 refills | Status: AC

## 2017-11-14 NOTE — Patient Instructions (Addendum)
You have been given 7 scripts for oxycodone 5 mg today.  You are tapering down.  Follow directions on prescription.Instructions also given by Dr Laban EmperorNaveira regarding the Morphine and oxycodone taper.

## 2017-11-14 NOTE — Progress Notes (Signed)
Nursing Pain Medication Assessment:  Safety precautions to be maintained throughout the outpatient stay will include: orient to surroundings, keep bed in low position, maintain call bell within reach at all times, provide assistance with transfer out of bed and ambulation.  Medication Inspection Compliance: Pill count conducted under aseptic conditions, in front of the patient. Neither the pills nor the bottle was removed from the patient's sight at any time. Once count was completed pills were immediately returned to the patient in their original bottle.  Medication #1: Morphine ER (MSContin) Pill/Patch Count: 25 of 60 pills remain Pill/Patch Appearance: Markings consistent with prescribed medication Bottle Appearance: Standard pharmacy container. Clearly labeled. Filled Date: 01 / 10 / 2019 Last Medication intake:  Today  Medication #2: Oxycodone ER (OxyContin) Pill/Patch Count: 13 of 28 pills remain Pill/Patch Appearance: Markings consistent with prescribed medication Bottle Appearance: Standard pharmacy container. Clearly labeled. Filled Date: 01 / 24 / 2018 Last Medication intake:  Today

## 2017-11-14 NOTE — Progress Notes (Signed)
Patient's Name: Ian Morse  MRN: [id number]  Referring Provider: Dellia Beckwith, *  DOB: [birth year]  PCP: Dellia Beckwith, PA-C  DOS: 11/14/2017  Note by: Gaspar Cola, MD  Service setting: Ambulatory outpatient  Specialty: Interventional Pain Management  Location: [facility] (AMB) Pain Management Facility    Patient type: Established   Primary Reason(s) for Visit: Encounter for prescription drug management. (Level of risk: moderate)  CC: Hand Pain (left)  HPI  Ian Morse is a 47 y.o. year old, male patient, who comes today for a medication management evaluation. 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); Long term (current) use of opiate analgesic (nonstop since 04/11/2011); Long term prescription opiate use; Opiate use (150 MME/Day); Chronic upper extremity pain (Primary Source of Pain) (Left); Chronic neck pain (Secondary source of pain) (Bilateral) (L>R); Chronic testicular pain [facility] source of pain) (Bilateral) (L>R); Chronic low back pain (Bilateral) (L>R); Spinal cord stimulator status; Encounter for interrogation of neurostimulator; History of Chronic CRPS of upper extremity (Left); Lumbar facet syndrome (Bilateral) (L>R); Opioid-induced constipation (OIC); Long term prescription benzodiazepine use (since before 04/11/2011); Chronic shoulder pain (Right); Osteoarthritis of shoulder (Right); DDD (degenerative disc disease), cervical (worst at C6-7); 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 His primarily concern today is the Hand Pain (left)  Pain Assessment: Location: Left  Hand Radiating: up arm to shoulder, pointer finger Onset: More than a month ago Duration: Chronic pain Quality: Constant, Aching, Radiating, Sore, Sharp Severity: 5 /10 (self-reported pain score)  Note: Reported level is inconsistent with clinical observations. Clinically the patient looks like a 2/10 A 2/10 is viewed as "Mild to Moderate" and described as noticeable and distracting. Impossible to hide from other people. More frequent flare-ups. Still possible to adapt and function close to normal. It can be very annoying and may have occasional stronger flare-ups. With discipline, patients may get used to it and adapt.       When using our objective Pain Scale, levels between 6 and 10/10 are said to belong in an emergency room, as it progressively worsens from a 6/10, described as severely limiting, requiring emergency care not usually available at an outpatient pain management facility. At a 6/10 level, communication becomes difficult and requires great effort. Assistance to reach the emergency department may be required. Facial flushing and profuse sweating along with potentially dangerous increases in heart rate and blood pressure will be evident. Effect on ADL: Making money, holding, gripping Timing: Constant Modifying factors: stimulator, medication  Ian Morse was last scheduled for an appointment on 08/24/2017 for medication management. During today's appointment we reviewed Ian Morse chronic pain status, as well as his outpatient medication regimen.  The patient has been tapering his opioids down from a starting dose of over 150 MME/day.  At this point, he is currently on 71.5 MME and doing well with his taper.  We will continue going down until we can completely stop it for at least 2 consecutive weeks before reconsidering restarting therapy.  According to my calculations, he should be able to complete his taper by 01/14/2018 and he will be done with his "Drug Holiday" by 01/28/2018.  I will see  him back before then for reassessment.  The  patient indicates that he might have reinjured his right shoulder.  I offered to repeat intra-articular injection, but he indicated that he can probably hold a little longer.  I have given him the option of a PRN procedure.  The patient  reports that he does not use drugs. His body mass index is 43.42 kg/m.  Further details on both, my assessment(s), as well as the proposed treatment plan, please see below.  Controlled Substance Pharmacotherapy Assessment REMS (Risk Evaluation and Mitigation Strategy)  Analgesic: Morphine ER 30 mg twice daily (60 MME/day) + oxycodone IR 5 mg (7.5 MME/day) MME/day: 67.5 mg/day.  [name], RN  11/14/2017 11:38 AM  Sign at close encounter Nursing Pain Medication Assessment:  Safety precautions to be maintained throughout the outpatient stay will include: orient to surroundings, keep bed in low position, maintain call bell within reach at all times, provide assistance with transfer out of bed and ambulation.  Medication Inspection Compliance: Pill count conducted under aseptic conditions, in front of the patient. Neither the pills nor the bottle was removed from the patient's sight at any time. Once count was completed pills were immediately returned to the patient in their original bottle.  Medication #1: Morphine ER (MSContin) Pill/Patch Count: 25 of 60 pills remain Pill/Patch Appearance: Markings consistent with prescribed medication Bottle Appearance: Standard pharmacy container. Clearly labeled. Filled Date: 01 / 10 / 2019 Last Medication intake:  Today  Medication #2: Oxycodone ER (OxyContin) Pill/Patch Count: 13 of 28 pills remain Pill/Patch Appearance: Markings consistent with prescribed medication Bottle Appearance: Standard pharmacy container. Clearly labeled. Filled Date: 01 / 24 / 2018 Last Medication intake:  Today   Pharmacokinetics: Liberation and absorption (onset of action):  WNL Distribution (time to peak effect): WNL Metabolism and excretion (duration of action): WNL         Pharmacodynamics: Desired effects: Analgesia: Ian Morse reports >50% benefit. Functional ability: Patient reports that medication allows him to accomplish basic ADLs Clinically meaningful improvement in function (CMIF): Sustained CMIF goals met Perceived effectiveness: Described as relatively effective, allowing for increase in activities of daily living (ADL) Undesirable effects: Side-effects or Adverse reactions: None reported Monitoring: [place] PMP: Online review of the past 74-monthperiod conducted. Compliant with practice rules and regulations Last UDS on record: Summary  Date Value Ref Range Status  04/05/2017 FINAL  Final    Comment:    ==================================================================== TOXASSURE COMP DRUG ANALYSIS,UR ==================================================================== Test                             Result       Flag       Units Drug Present and Declared for Prescription Verification   Morphine                       >8197        EXPECTED   ng/mg creat    Potential sources of large amounts of morphine in the absence of    codeine include administration of morphine or use of heroin.   Oxycodone                      730          EXPECTED   ng/mg creat   Oxymorphone                    330  EXPECTED   ng/mg creat   Noroxycodone                   4178         EXPECTED   ng/mg creat   Noroxymorphone                 179          EXPECTED   ng/mg creat    Sources of oxycodone are scheduled prescription medications.    Oxymorphone, noroxycodone, and noroxymorphone are expected    metabolites of oxycodone. Oxymorphone is also available as a    scheduled prescription medication.   Gabapentin                     PRESENT      EXPECTED   Trazodone                      PRESENT      EXPECTED   1,3 chlorophenyl piperazine    PRESENT      EXPECTED     1,3-chlorophenyl piperazine is an expected metabolite of    trazodone.   Acetaminophen                  PRESENT      EXPECTED Drug Present not Declared for Prescription Verification   Salicylate                     PRESENT      UNEXPECTED Drug Absent but Declared for Prescription Verification   Butalbital                     Not Detected UNEXPECTED   Duloxetine                     Not Detected UNEXPECTED   Fluoxetine                     Not Detected UNEXPECTED ==================================================================== Test                      Result    Flag   Units      Ref Range   Creatinine              122              mg/dL      >=20 ==================================================================== Declared Medications:  The flagging and interpretation on this report are based on the  following declared medications.  Unexpected results may arise from  inaccuracies in the declared medications.  **Note: The testing scope of this panel includes these medications:  Butalbital (Butalbital/APAP/Caffeine)  Duloxetine  Fluoxetine  Gabapentin  Morphine (Morphine Sulfate)  Oxycodone  Trazodone  **Note: The testing scope of this panel does not include small to  moderate amounts of these reported medications:  Acetaminophen (Butalbital/APAP/Caffeine)  **Note: The testing scope of this panel does not include following  reported medications:  Albuterol  Caffeine (Butalbital/APAP/Caffeine)  Calcium Carbonate (Calcium Carb/Vitamin D)  Cyanocobalamin  Fluticasone  Herbal Product  Hydrochlorothiazide (Lisinopril-HCTZ)  Lactulose  Lisinopril (Lisinopril-HCTZ)  Magnesium (Mag)  Multivitamin (MVI)  Potassium  Supplement (Krill Oil)  Testosterone  Turmeric  Vitamin D (Calcium Carb/Vitamin D) ==================================================================== For clinical consultation, please call [phone]. ====================================================================    UDS interpretation: Compliant          Medication Assessment Form: Reviewed.  Patient indicates being compliant with therapy Treatment compliance: Compliant Risk Assessment Profile: Aberrant behavior: See prior evaluations. None observed or detected today Comorbid factors increasing risk of overdose: See prior notes. No additional risks detected today Risk of substance use disorder (SUD): Low Opioid Risk Tool - 11/14/17 1133      Family History of Substance Abuse   Alcohol  Negative    Illegal Drugs  Negative    Rx Drugs  Negative      Personal History of Substance Abuse   Alcohol  Negative    Illegal Drugs  Negative    Rx Drugs  Negative      Total Score   Opioid Risk Tool Scoring  0    Opioid Risk Interpretation  Low Risk      ORT Scoring interpretation table:  Score <3 = Low Risk for SUD  Score between 4-7 = Moderate Risk for SUD  Score >8 = High Risk for Opioid Abuse   Risk Mitigation Strategies:  Patient Counseling: Covered Patient-Prescriber Agreement (PPA): Present and active  Notification to other healthcare providers: Done  Pharmacologic Plan: No change in therapy, at this time.             Laboratory Chemistry  Inflammation Markers (CRP: Acute Phase) (ESR: Chronic Phase) Lab Results  Component Value Date   CRP 5.7 (H) 04/05/2017   ESRSEDRATE 10 04/05/2017                 Renal Function Markers Lab Results  Component Value Date   BUN 15 04/05/2017   CREATININE 0.68 (L) 04/05/2017   GFRAA 131 04/05/2017   GFRNONAA 114 04/05/2017                 Hepatic Function Markers Lab Results  Component Value Date   AST 16 04/05/2017   ALT 24 04/05/2017   ALBUMIN 4.5 04/05/2017   ALKPHOS 82 04/05/2017                 Electrolytes Lab Results  Component Value Date   NA 144 04/05/2017   K 4.1 04/05/2017   CL 102 04/05/2017   CALCIUM 9.5 04/05/2017   MG 2.1 04/05/2017                  Neuropathy Markers Lab Results  Component Value Date   VITAMINB12 438 04/05/2017                 Bone Pathology Markers Lab Results  Component Value Date   25OHVITD1 38 04/05/2017   25OHVITD2 8.1 04/05/2017   25OHVITD3 30 04/05/2017                 Coagulation Parameters Lab Results  Component Value Date   PLT 316 08/21/2014                 Cardiovascular Markers Lab Results  Component Value Date   HGB 12.4 (L) 08/21/2014   HCT 37.8 (L) 08/21/2014                 CA Markers No results found for: CEA, CA125, LABCA2               Note: Lab results reviewed.  Recent Diagnostic Imaging Results  DG Lumbar Spine Complete W/Bend CLINICAL DATA:  Low back pain  EXAM: LUMBAR SPINE - COMPLETE WITH BENDING VIEWS  COMPARISON:  CT 05/18/2006  FINDINGS: Lumbosacral transitional vertebra with S1-S2 disc demonstrating grade 1 anterolisthesis of up  to 6 mm involving S1 on S2 on the neutral and flexion lateral projections which reduces upon extension. No spondylolysis is identified. Findings are likely on the basis of degenerative facet arthropathy which are noted at L4-5 and L5-S1. A neurostimulator generator projects over the posterior elements at L4 and L5. No acute fracture nor suspicious osseous lesions.  IMPRESSION: 1. Lumbosacral transitional vertebra with S1-S2 disc. 2. Grade 1 anterolisthesis of S1 on S2 in the neutral and flexion lateral views which appears to reduce upon extension.  Electronically Signed   By: Ashley Royalty M.D.   On: 04/20/2017 13:55  DG Cervical Spine Complete CLINICAL DATA:  Neck pain  EXAM: CERVICAL SPINE - COMPLETE 4+ VIEW  COMPARISON:  None.  FINDINGS: There is no evidence of cervical spine fracture or prevertebral soft tissue swelling. The atlantodental interval is within normal limits. No splaying of the lateral masses of C1 on C2. The odontoid process appears intact. Neural stimulator lead projects up to the C4-5  disc level within the central canal posteriorly. Normal cervical lordosis. Slight disc space narrowing anteriorly at C6-7. Facet joints are maintained. No other significant bone abnormalities are identified.  IMPRESSION: 1. Slight disc space narrowing at C6-7. 2. Neural stimulator lead projects up to the C4-5 disc level. 3. No acute osseous abnormality.  Electronically Signed   By: Ashley Royalty M.D.   On: 04/20/2017 13:46  Complexity Note: Imaging results reviewed. Results shared with Mr. Lembcke, using Layman's terms.                         Meds   Current Outpatient Medications:  .  albuterol (PROAIR HFA) 108 (90 Base) MCG/ACT inhaler, Inhale 2 puffs into the lungs as needed., Disp: , Rfl:  .  butalbital-acetaminophen-caffeine (FIORICET, ESGIC) 50-325-40 MG tablet, Take 1 tablet by mouth every 4 (four) hours as needed for headache., Disp: , Rfl:  .  DULoxetine (CYMBALTA) 60 MG capsule, Take 60 mg by mouth 2 (two) times daily., Disp: , Rfl:  .  fluticasone (FLONASE) 50 MCG/ACT nasal spray, Place 2 sprays into the nose as needed., Disp: , Rfl:  .  gabapentin (NEURONTIN) 800 MG tablet, Take 800 mg by mouth 4 (four) times daily. , Disp: , Rfl:  .  Ginkgo Biloba (GNP GINGKO BILOBA EXTRACT PO), Take daily at 6 (six) AM by mouth. , Disp: , Rfl:  .  lactulose (CHRONULAC) 10 GM/15ML solution, Take 15-30 mLs by mouth once., Disp: , Rfl:  .  lisinopril-hydrochlorothiazide (PRINZIDE,ZESTORETIC) 10-12.5 MG tablet, Take 1 tablet by mouth daily., Disp: , Rfl:  .  morphine (MS CONTIN) 30 MG 12 hr tablet, Take 1 tablet (30 mg total) every 12 (twelve) hours by mouth., Disp: 60 tablet, Rfl: 0 .  Multiple Vitamins-Minerals (EYE VITAMINS PO), Take by mouth 2 (two) times daily., Disp: , Rfl:  .  naloxone (NARCAN) nasal spray 4 mg/0.1 mL, Place 1 spray into the nose 3 times/day as needed-between meals & bedtime., Disp: , Rfl:  .  NEOMYCIN-POLYMYXIN-HYDROCORTISONE (CORTISPORIN) 1 % SOLN OTIC solution, , Disp:  , Rfl:  .  [START ON 11/26/2017] oxyCODONE (OXY IR/ROXICODONE) 5 MG immediate release tablet, Take 1-2 tablets (5-10 mg total) by mouth every 6 (six) hours as needed for up to 7 days for severe pain. Max: 7/day, Disp: 49 tablet, Rfl: 0 .  [START ON 12/03/2017] oxyCODONE (OXY IR/ROXICODONE) 5 MG immediate release tablet, Take 1 tablet (5 mg total) by mouth 6 (six) times  daily for 7 days. Max: 6/day, Disp: 42 tablet, Rfl: 0 .  [START ON 12/10/2017] oxyCODONE (OXY IR/ROXICODONE) 5 MG immediate release tablet, Take 1 tablet (5 mg total) by mouth 5 (five) times daily for 7 days. Max: 5/day, Disp: 35 tablet, Rfl: 0 .  [START ON 12/17/2017] oxyCODONE (OXY IR/ROXICODONE) 5 MG immediate release tablet, Take 1 tablet (5 mg total) by mouth 4 (four) times daily for 7 days. Max: 4/day, Disp: 28 tablet, Rfl: 0 .  [START ON 12/24/2017] oxyCODONE (OXY IR/ROXICODONE) 5 MG immediate release tablet, Take 1 tablet (5 mg total) by mouth 3 (three) times daily for 7 days. Max: 3/day, Disp: 21 tablet, Rfl: 0 .  [START ON 12/31/2017] oxyCODONE (OXY IR/ROXICODONE) 5 MG immediate release tablet, Take 1 tablet (5 mg total) by mouth 2 (two) times daily for 7 days. Max: 2/day, Disp: 14 tablet, Rfl: 0 .  [START ON 01/07/2018] oxyCODONE (OXY IR/ROXICODONE) 5 MG immediate release tablet, Take 1 tablet (5 mg total) by mouth daily for 7 days. Max: 1/day, Disp: 7 tablet, Rfl: 0 .  potassium chloride (K-DUR,KLOR-CON) 10 MEQ tablet, TAKE 2 TABLETS DAILY, Disp: , Rfl:  .  Testosterone (ANDROGEL PUMP) 20.25 MG/ACT (1.62%) GEL, 20.25 mg., Disp: , Rfl:  .  traZODone (DESYREL) 150 MG tablet, Take 150 mg by mouth every evening., Disp: , Rfl:  .  TURMERIC PO, Take daily at 6 (six) AM by mouth. , Disp: , Rfl:  .  varenicline (CHANTIX CONTINUING MONTH PAK) 1 MG tablet, Take 1 tablet (1 mg total) by mouth 2 (two) times daily., Disp: 60 tablet, Rfl: 0 .  vitamin B-12 (CYANOCOBALAMIN) 100 MCG tablet, Take 100 mcg by mouth daily., Disp: , Rfl:  .  oxyCODONE (OXY  IR/ROXICODONE) 5 MG immediate release tablet, Take 1 tablet (5 mg total) daily for 7 days by mouth. Max: 1/day, Disp: 7 tablet, Rfl: 0  ROS  Constitutional: Denies any fever or chills Gastrointestinal: No reported hemesis, hematochezia, vomiting, or acute GI distress Musculoskeletal: Denies any acute onset joint swelling, redness, loss of ROM, or weakness Neurological: No reported episodes of acute onset apraxia, aphasia, dysarthria, agnosia, amnesia, paralysis, loss of coordination, or loss of consciousness  Allergies  Mr. Lukas is allergic to lyrica [pregabalin].  [place]  Drug: Mr. Thomley  reports that he does not use drugs. Alcohol:  reports that he does not drink alcohol. Tobacco:  reports that  has never smoked. His smokeless tobacco use includes chew. Medical:  has a past medical history of Anxiety, Depression, Hypertension, MRSA carrier, Neuromuscular disorder ([place]), and Opioid-induced constipation (OIC) (04/06/2017). Surgical: Mr. Dosanjh  has a past surgical history that includes Spinal cord stimulator insertion and Surgery scrotal / testicular. Family: family history is not on file.  Constitutional Exam  General appearance: Well nourished, well developed, and well hydrated. In no apparent acute distress Vitals:   11/14/17 1123  BP: 132/64  Pulse: 72  Resp: 16  Temp: 98.7 F (37.1 C)  SpO2: 99%  Weight: 294 lb (133.4 kg)  Height: 5' 9"  (1.753 m)   BMI Assessment: Estimated body mass index is 43.42 kg/m as calculated from the following:   Height as of this encounter: 5' 9"  (1.753 m).   Weight as of this encounter: 294 lb (133.4 kg).  BMI interpretation table: BMI level Category Range association with higher incidence of chronic pain  <18 kg/m2 Underweight   18.5-24.9 kg/m2 Ideal body weight   25-29.9 kg/m2 Overweight Increased incidence by 20%  30-34.9 kg/m2  Obese (Class I) Increased incidence by 68%  35-39.9 kg/m2 Severe obesity (Class II) Increased incidence by 136%   >40 kg/m2 Extreme obesity (Class III) Increased incidence by 254%   BMI Readings from Last 4 Encounters:  11/14/17 43.42 kg/m  08/24/17 43.27 kg/m  07/27/17 43.71 kg/m  06/23/17 44.01 kg/m   Wt Readings from Last 4 Encounters:  11/14/17 294 lb (133.4 kg)  08/24/17 293 lb (132.9 kg)  07/27/17 296 lb (134.3 kg)  06/23/17 298 lb (135.2 kg)  Psych/Mental status: Alert, oriented x 3 (person, place, & time)       Eyes: PERLA Respiratory: No evidence of acute respiratory distress  Cervical Spine Area Exam  Skin & Axial Inspection: No masses, redness, edema, swelling, or associated skin lesions Alignment: Symmetrical Functional ROM: Unrestricted ROM      Stability: No instability detected Muscle Tone/Strength: Functionally intact. No obvious neuro-muscular anomalies detected. Sensory (Neurological): Unimpaired Palpation: No palpable anomalies              Upper Extremity (UE) Exam    Side: Right upper extremity  Side: Left upper extremity  Skin & Extremity Inspection: Skin color, temperature, and hair growth are WNL. No peripheral edema or cyanosis. No masses, redness, swelling, asymmetry, or associated skin lesions. No contractures.  Skin & Extremity Inspection: Skin color, temperature, and hair growth are WNL. No peripheral edema or cyanosis. No masses, redness, swelling, asymmetry, or associated skin lesions. No contractures.  Functional ROM: Unrestricted ROM          Functional ROM: Unrestricted ROM          Muscle Tone/Strength: Functionally intact. No obvious neuro-muscular anomalies detected.  Muscle Tone/Strength: Functionally intact. No obvious neuro-muscular anomalies detected.  Sensory (Neurological): Unimpaired          Sensory (Neurological): Unimpaired          Palpation: No palpable anomalies              Palpation: No palpable anomalies              Specialized Test(s): Deferred         Specialized Test(s): Deferred          Thoracic Spine Area Exam  Skin & Axial  Inspection: No masses, redness, or swelling Alignment: Symmetrical Functional ROM: Unrestricted ROM Stability: No instability detected Muscle Tone/Strength: Functionally intact. No obvious neuro-muscular anomalies detected. Sensory (Neurological): Unimpaired Muscle strength & Tone: No palpable anomalies  Lumbar Spine Area Exam  Skin & Axial Inspection: No masses, redness, or swelling Alignment: Symmetrical Functional ROM: Unrestricted ROM      Stability: No instability detected Muscle Tone/Strength: Functionally intact. No obvious neuro-muscular anomalies detected. Sensory (Neurological): Unimpaired Palpation: No palpable anomalies       Provocative Tests: Lumbar Hyperextension and rotation test: evaluation deferred today       Lumbar Lateral bending test: evaluation deferred today       Patrick's Maneuver: evaluation deferred today                    Gait & Posture Assessment  Ambulation: Unassisted Gait: Relatively normal for age and body habitus Posture: WNL   Lower Extremity Exam    Side: Right lower extremity  Side: Left lower extremity  Skin & Extremity Inspection: Skin color, temperature, and hair growth are WNL. No peripheral edema or cyanosis. No masses, redness, swelling, asymmetry, or associated skin lesions. No contractures.  Skin & Extremity Inspection: Skin color, temperature, and hair growth  are WNL. No peripheral edema or cyanosis. No masses, redness, swelling, asymmetry, or associated skin lesions. No contractures.  Functional ROM: Unrestricted ROM          Functional ROM: Unrestricted ROM          Muscle Tone/Strength: Functionally intact. No obvious neuro-muscular anomalies detected.  Muscle Tone/Strength: Functionally intact. No obvious neuro-muscular anomalies detected.  Sensory (Neurological): Unimpaired  Sensory (Neurological): Unimpaired  Palpation: No palpable anomalies  Palpation: No palpable anomalies   Assessment  Primary Diagnosis & Pertinent Problem  List: The primary encounter diagnosis was Chronic pain syndrome. Diagnoses of Chronic shoulder pain (Right), Chronic upper extremity pain (Primary Source of Pain) (Left), Long term (current) use of opiate analgesic (nonstop since 04/11/2011), Long term prescription benzodiazepine use (since before 04/11/2011), Opiate use (150 MME/Day), Opioid-induced constipation (OIC), and History of Complex regional pain syndrome type I of upper extremity (Left) were also pertinent to this visit.  Status Diagnosis  Controlled Controlled Controlled 1. Chronic pain syndrome   2. Chronic shoulder pain (Right)   3. Chronic upper extremity pain (Primary Source of Pain) (Left)   4. Long term (current) use of opiate analgesic (nonstop since 04/11/2011)   5. Long term prescription benzodiazepine use (since before 04/11/2011)   6. Opiate use (150 MME/Day)   7. Opioid-induced constipation (OIC)   8. History of Complex regional pain syndrome type I of upper extremity (Left)     Problems updated and reviewed during this visit: No problems updated. Plan of Care  Pharmacotherapy (Medications Ordered): Meds ordered this encounter  Medications  . oxyCODONE (OXY IR/ROXICODONE) 5 MG immediate release tablet    Sig: Take 1-2 tablets (5-10 mg total) by mouth every 6 (six) hours as needed for up to 7 days for severe pain. Max: 7/day    Dispense:  49 tablet    Refill:  0    This prescription is part of a downward opioid taper. Fill instructions must be followed exactly as written to avoid withdrawal. Fill date: 11/26/17 To last until: 12/03/17  . oxyCODONE (OXY IR/ROXICODONE) 5 MG immediate release tablet    Sig: Take 1 tablet (5 mg total) by mouth 6 (six) times daily for 7 days. Max: 6/day    Dispense:  42 tablet    Refill:  0    This prescription is part of a downward opioid taper. Fill instructions must be followed exactly as written to avoid withdrawal. Fill date: 12/03/17 To last until: 12/10/17  . oxyCODONE (OXY  IR/ROXICODONE) 5 MG immediate release tablet    Sig: Take 1 tablet (5 mg total) by mouth 5 (five) times daily for 7 days. Max: 5/day    Dispense:  35 tablet    Refill:  0    This prescription is part of a downward opioid taper. Fill instructions must be followed exactly as written to avoid withdrawal. Fill date: 12/10/17 To last until: 12/17/17  . oxyCODONE (OXY IR/ROXICODONE) 5 MG immediate release tablet    Sig: Take 1 tablet (5 mg total) by mouth 4 (four) times daily for 7 days. Max: 4/day    Dispense:  28 tablet    Refill:  0    This prescription is part of a downward opioid taper. Fill instructions must be followed exactly as written to avoid withdrawal. Fill date: 12/17/17 To last until: 12/24/17  . oxyCODONE (OXY IR/ROXICODONE) 5 MG immediate release tablet    Sig: Take 1 tablet (5 mg total) by mouth 3 (three) times daily for  7 days. Max: 3/day    Dispense:  21 tablet    Refill:  0    This prescription is part of a downward opioid taper. Fill instructions must be followed exactly as written to avoid withdrawal. Fill date: 12/24/17 To last until: 12/31/17  . oxyCODONE (OXY IR/ROXICODONE) 5 MG immediate release tablet    Sig: Take 1 tablet (5 mg total) by mouth 2 (two) times daily for 7 days. Max: 2/day    Dispense:  14 tablet    Refill:  0    This prescription is part of a downward opioid taper. Fill instructions must be followed exactly as written to avoid withdrawal. Fill date: 12/31/17 To last until: 01/07/18  . oxyCODONE (OXY IR/ROXICODONE) 5 MG immediate release tablet    Sig: Take 1 tablet (5 mg total) by mouth daily for 7 days. Max: 1/day    Dispense:  7 tablet    Refill:  0    This prescription is part of a downward opioid taper. Fill instructions must be followed exactly as written to avoid withdrawal. Fill date: 01/07/18 To last until: 01/14/18   Medications administered today: Lucas L. Burtch had no medications administered during this visit.   Procedure Orders      SHOULDER INJECTION Lab Orders  No laboratory test(s) ordered today   Imaging Orders  No imaging studies ordered today   Referral Orders  No referral(s) requested today    Interventional management options: Planned, scheduled, and/or pending:   Stimulator working OK and taking care of arm pain. He will be tapering his opioids down over the next couple of months at a rate of 5 mg (oxycodone) per week.  We should be done with this process by 01/14/2018 and done with the drug holiday by 01/28/2018.   Considering:   Diagnostic right intra-articular shoulder joint injection #2  Diagnostic rightsuprascapular nerve block Possiblerightsuprascapular nerve RFA Diagnostic left cervical epidural steroid injection Diagnostic bilateral cervical facet block Possible bilateral cervical facet RFA Diagnostic bilateral lumbar facet block Possible bilateral lumbar facet RFA   Palliative PRN treatment(s):   Palliative right intra-articular shoulder joint injection #2    Provider-requested follow-up: Return in 2 months (on 01/25/2018) for Med-Mgmt, w/ Dr. Dossie Arbour on 01/25/2018., PRN Procedure.  Future Appointments  Date Time Provider [place]  01/23/2018 10:30 AM Milinda Pointer, [place] None   Primary Care Physician: Dellia Beckwith, PA-C Location: [facility] Note by: Gaspar Cola, MD Date: 11/14/2017; Time: 3:34 PM

## 2017-12-05 ENCOUNTER — Telehealth: Payer: Self-pay | Admitting: *Deleted

## 2017-12-05 NOTE — Telephone Encounter (Signed)
Patient's wife was not at last appointment when he came in to start taper/drug holiday. Explained via the office notes. Patient can come in for procedure prn, but has an appointment 01/23/2018, after completing drug holiday.

## 2017-12-19 ENCOUNTER — Telehealth: Payer: Self-pay | Admitting: *Deleted

## 2017-12-19 NOTE — Telephone Encounter (Signed)
Ian Morse, the patient needs to get a referral from his PCP.

## 2018-01-23 ENCOUNTER — Ambulatory Visit: Payer: Medicare Other | Admitting: Pain Medicine

## 2019-01-18 IMAGING — CR DG LUMBAR SPINE COMPLETE W/ BEND
7 series · 7 of 7 positions shown · non-contrast
Comparison: CT 05/18/2006

CLINICAL DATA: Low back pain

EXAM:
LUMBAR SPINE - COMPLETE WITH BENDING VIEWS

[l-spine ap]
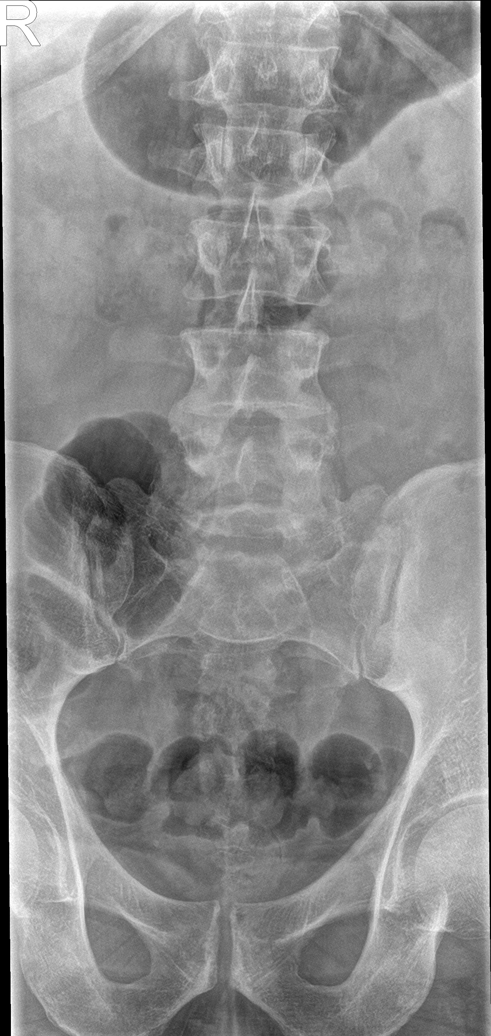

[l-spine obl (1 of 2)]
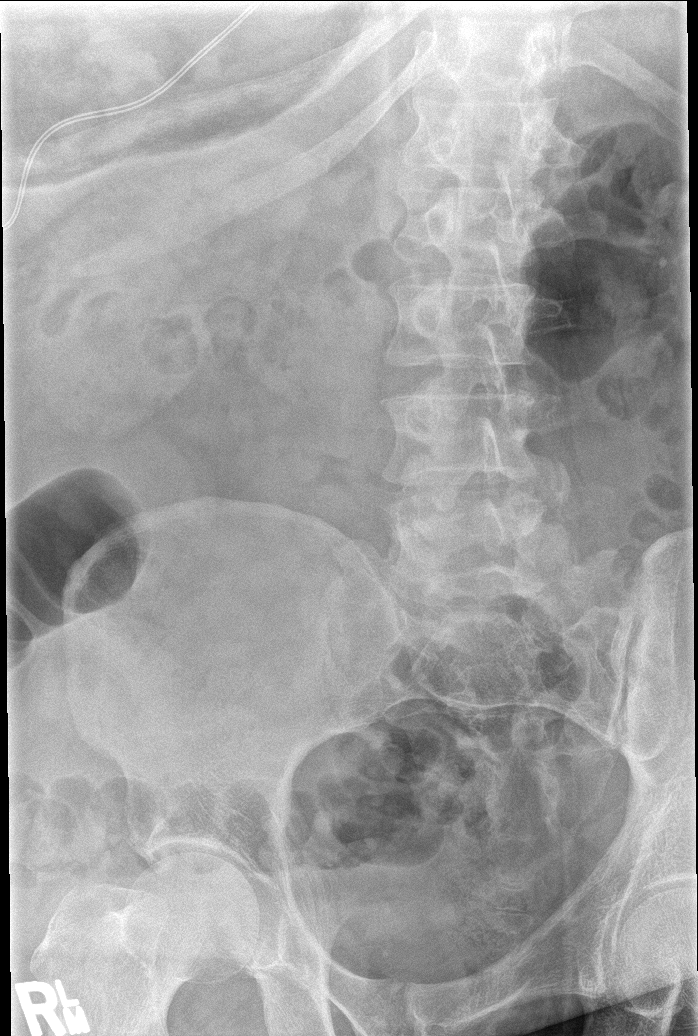

[l-spine obl (2 of 2)]
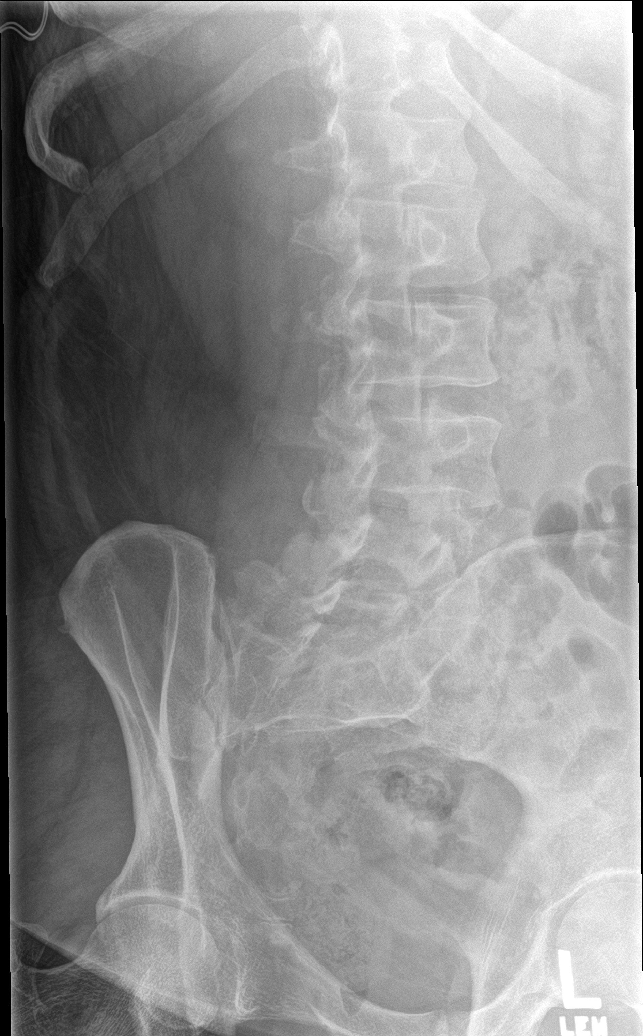

[l-spine lat]
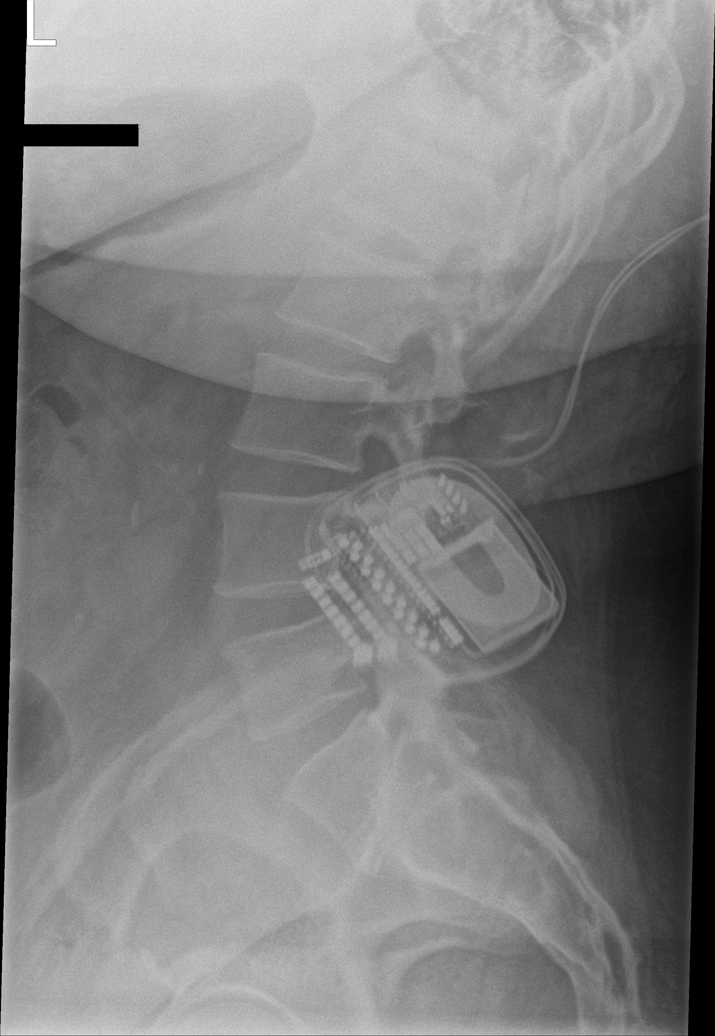

[l-spine flex]
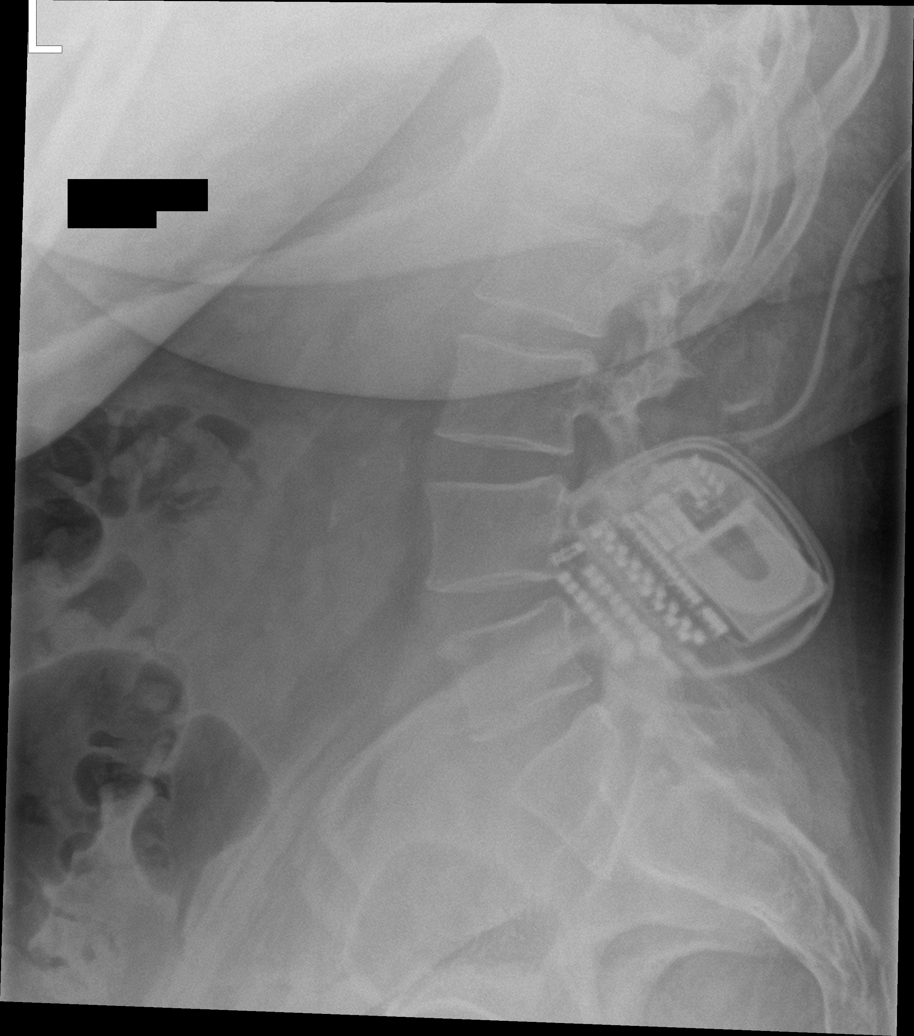

[l-spine ext]
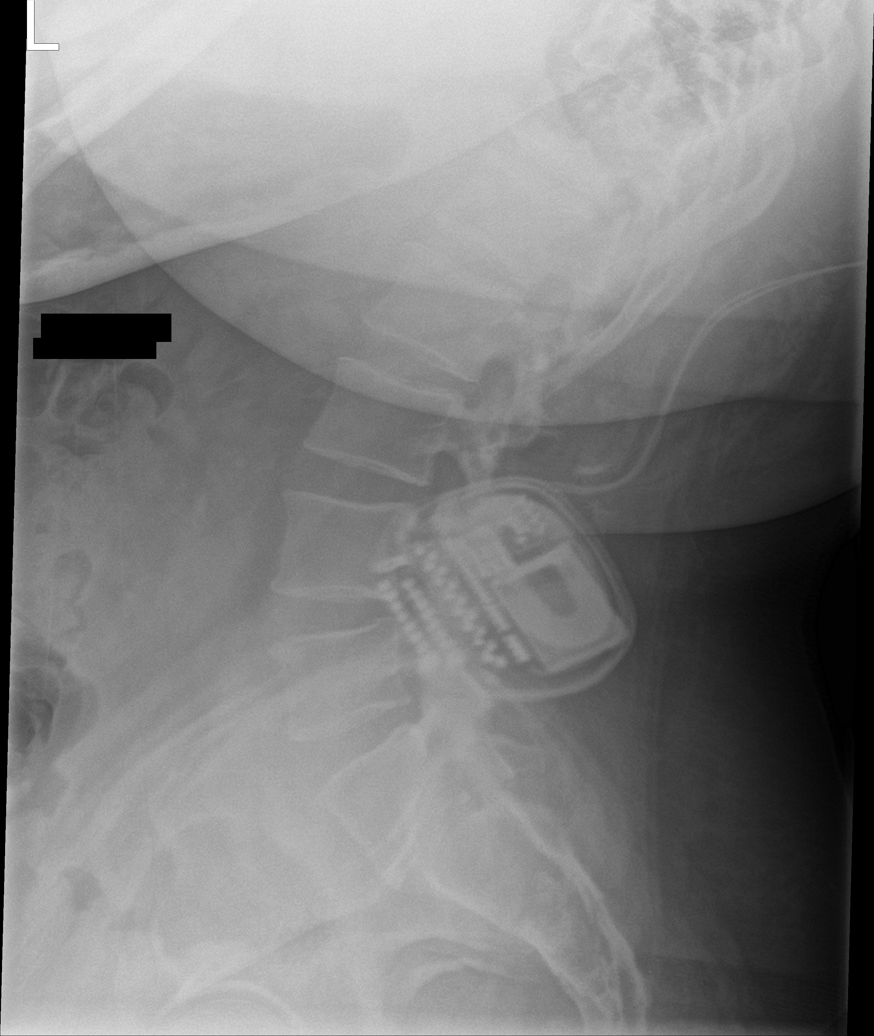

[l-spine spot]
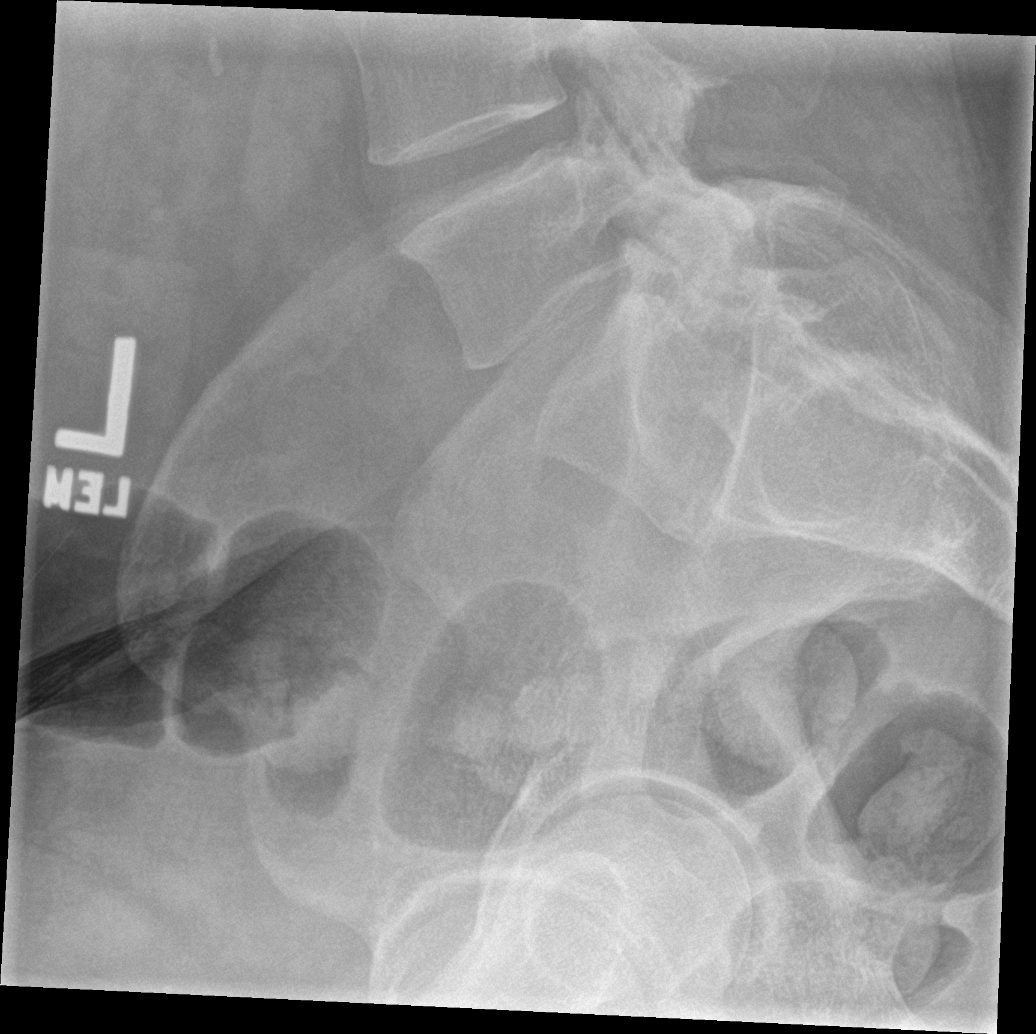

[7 of 7 positions shown; findings below may reference images not displayed]

FINDINGS: Lumbosacral transitional vertebra with S1-S2 disc demonstrating
grade 1 anterolisthesis of up to 6 mm involving S1 on S2 on the
neutral and flexion lateral projections which reduces upon
extension. No spondylolysis is identified. Findings are likely on
the basis of degenerative facet arthropathy which are noted at L4-5
and L5-S1. A neurostimulator generator projects over the posterior
elements at L4 and L5. No acute fracture nor suspicious osseous
lesions.
IMPRESSION: 1. Lumbosacral transitional vertebra with S1-S2 disc.
2. Grade 1 anterolisthesis of S1 on S2 in the neutral and flexion
lateral views which appears to reduce upon extension.

## 2019-11-07 ENCOUNTER — Encounter: Payer: Medicare Other | Admitting: Pain Medicine

## 2022-03-29 ENCOUNTER — Ambulatory Visit: Payer: Medicare PPO | Admitting: Pain Medicine

## 2023-02-17 ENCOUNTER — Other Ambulatory Visit: Payer: Self-pay | Admitting: Nurse Practitioner

## 2023-12-22 ENCOUNTER — Encounter: Payer: Self-pay | Admitting: *Deleted

## 2024-01-02 DIAGNOSIS — Z0289 Encounter for other administrative examinations: Secondary | ICD-10-CM

## 2024-02-06 ENCOUNTER — Encounter (INDEPENDENT_AMBULATORY_CARE_PROVIDER_SITE_OTHER): Payer: Self-pay | Admitting: Family Medicine

## 2024-02-06 ENCOUNTER — Ambulatory Visit (INDEPENDENT_AMBULATORY_CARE_PROVIDER_SITE_OTHER): Admitting: Family Medicine

## 2024-02-06 VITALS — BP 116/75 | HR 76 | Temp 99.2°F | Ht 68.0 in | Wt 285.0 lb

## 2024-02-06 DIAGNOSIS — R0602 Shortness of breath: Secondary | ICD-10-CM | POA: Diagnosis not present

## 2024-02-06 DIAGNOSIS — G4733 Obstructive sleep apnea (adult) (pediatric): Secondary | ICD-10-CM

## 2024-02-06 DIAGNOSIS — Z1331 Encounter for screening for depression: Secondary | ICD-10-CM

## 2024-02-06 DIAGNOSIS — F39 Unspecified mood [affective] disorder: Secondary | ICD-10-CM

## 2024-02-06 DIAGNOSIS — R5383 Other fatigue: Secondary | ICD-10-CM

## 2024-02-06 DIAGNOSIS — G90512 Complex regional pain syndrome I of left upper limb: Secondary | ICD-10-CM | POA: Diagnosis not present

## 2024-02-06 DIAGNOSIS — E669 Obesity, unspecified: Secondary | ICD-10-CM | POA: Diagnosis not present

## 2024-02-06 DIAGNOSIS — F5089 Other specified eating disorder: Secondary | ICD-10-CM

## 2024-02-06 DIAGNOSIS — Z6841 Body Mass Index (BMI) 40.0 and over, adult: Secondary | ICD-10-CM

## 2024-02-06 DIAGNOSIS — I10 Essential (primary) hypertension: Secondary | ICD-10-CM | POA: Diagnosis not present

## 2024-02-06 NOTE — Progress Notes (Signed)
 Ian Morse, D.O.  ABFM, ABOM Specializing in Clinical Bariatric Medicine Office located at: [address] [place], [place]  [address]   Bariatric Medicine Visit  Dear Ian Morse, Ian Morse, *  Thank you for referring Ian Morse to our clinic today for evaluation.  We performed a consultation to discuss his options for treatment and educate the patient on his disease state.  The following note includes my evaluation and treatment recommendations.   Please do not hesitate to reach out to me directly if you have any further concerns.   Assessment and Plan:   Orders Placed This Encounter  Procedures   VITAMIN D  25 Hydroxy (Vit-D Deficiency, Fractures)   Insulin , random   Folate   Vitamin B12   Hemoglobin A1c   EKG 12-Lead   There are no discontinued medications.   No orders of the defined types were placed in this encounter.  Ian Morse received labs on 01/09/2024 (TSH, FLP, CMP, and CBC), will review labs obtained today at next OV (Vit D, Insulin , Folate, B12, and A1c).  FOR THE DISEASE OF Ian: Recommended Dietary Goals Ian Morse is currently in the action stage of change. As such, his goal is to start our weight management plan.  He has agreed to implement: Modified Category 3 with B/L options with 200 snack calories   Behavioral Intervention We discussed the following Behavioral Modification Strategies today: increasing lean protein intake to established goals, decreasing simple carbohydrates , and increasing water intake   Additional resources provided today: Handout on CAT 3 meal plan, Handout on CAT 3-4 breakfast options, and Handout on CAT 3-4 lunch options  Evidence-based interventions for health behavior change were utilized today including the discussion of self monitoring techniques, problem-solving barriers and SMART goal setting techniques.    Pt will specifically work on: Eat all food on new MP for next visit.    Recommended Physical Activity  Goals Ian Morse has been advised to work up to 150 minutes of moderate intensity aerobic activity a week and strengthening exercises 2-3 times per week for cardiovascular health, weight loss maintenance and preservation of muscle mass.   He has agreed to : maintain current level of activity.    Pharmacotherapy We both agreed to : continue with nutritional and behavioral strategies   FOR ASSOCIATED CONDITIONS ADDRESSED TODAY:  Fatigue Assessment & Plan: Ian Morse does feel that his weight is causing his energy to be lower than it should be. Fatigue may be related to Ian, depression or many other causes. he does not appear to have any red flag symptoms and this appears to most likely be related to his current lifestyle habits and dietary intake.  Labs will be ordered and reviewed with him at their next office visit in two weeks.  Relevant Orders: -     VITAMIN D  25 Hydroxy (Vit-D Deficiency,  -     EKG 12-Lead   Epworth sleepiness scale is 10 and appears to not be within normal limits. Ian Morse reports daytime somnolence and reports waking up still tired. Patient has a history of symptoms of daytime fatigue, Epworth sleepiness scale, and hypertension. Ian Morse generally gets 8 or 9 hours of sleep per night, and states that he has generally restful sleep. Snoring are not present. Apneic episodes are not present.   ECG: Performed and reviewed/ interpreted independently.  Normal sinus rhythm, rate 72 bpm; reassuring without any acute abnormalities, will continue to monitor for symptoms    Shortness of breath on exertion Assessment &  Plan: Ian Morse does feel that he gets out of breath more easily than he used to when he exercises and seems to be worsening over time with weight gain.  This has gotten worse recently. Ian Morse denies shortness of breath at rest or orthopnea. Pt denies chest pain, dizziness, heart palpitations, or excessive diaphoresis or nausea with activity.  This is not new and is  ongoing.  Ian Morse's shortness of breath appears to be Ian related and exercise induced, as they do not appear to have any "red flag" symptoms/ concerns today.  Also, this condition appears to be related to a state of poor cardiovascular conditioning   Obtain labs today and will be reviewed with him at their next office visit in two weeks.  Indirect Calorimeter completed today to help guide our dietary regimen. It shows a VO2 of 327 and a REE of 2261.  His calculated basal metabolic rate is 1610 thus his resting energy expenditure is worse than expected.  Patient agreed to work on weight loss at this time.  As Ian Morse progresses through our weight loss program, we will gradually increase exercise as tolerated to treat his current condition.   If Ian Morse follows our recommendations and loses 5-10% of their weight without improvement of his shortness of breath or if at any time, symptoms become more concerning, they agree to urgently follow up with their PCP/ specialist for further consideration/ evaluation.   Ian Morse verbalizes agreement with this plan.   Mood disorder (HCC) - emotional eating Depression Screen  Assessment & Plan: His PHQ-9 score was positive at 12. Ian Morse takes Abilify 15 mg daily, Buspar 15 mg 3x daily, Trazodone 150 mg daily, and Hydroxizine 25 mg 3x per day PRN. Pt is tolerating all medications well, denies any SI/HI. All medications given by PCP. He does not have a current therapist. Pt admits to eating to comfort himself, however he does not feel emotional eating is an issue. Recommended consultation with PCP about medication regimen as Abilify and Hydroxizine are obesogenic medications.   History of Complex regional pain syndrome type I of upper extremity (Left) Assessment & Plan: For pain management, Ian Morse is taking Oxycodone  IR, Cymbalta 60 mg twice daily, Morphine  30 mg twice daily, and Neurontin 800 mg four times daily. 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 He has not worked since 2005. I have recognized pt is on Neurontin, which is needed for neuropathy. However, encouraged to notify treating provider that it is an obesogenic medication. Will monitor closely alongside PCP.  Relevant Orders: -     Insulin , random -     Hemoglobin A1c   Benign essential hypertension Assessment & Plan: BP Readings from Last 3 Encounters:  02/06/24 116/75  11/14/17 132/64  08/24/17 128/61  Ian Morse takes Lisinopril-HCTZ 10-12.5 mg daily. He was diagnosed several years ago with this condition. Pt reports with BP at goal. Continue current medication regimen. No acute concerns today.  Relevant Orders: -     Folate -     Vitamin B12   OSA on CPAP Assessment & Plan: Ian Morse has been using a CPAP machine for 7-8 years. Pt reports condition is well controlled. He sees sleep specialist once annually. Ian Morse was last seen by Dr. Allan Ishihara of sleep medicine on 05/26/2023, however I could not access records today from that visit. Encouraged pt to possibly get CPAP settings changed d/t epworth sleepiness scale and pt admitting to having a good chance of dozing  off during the day. Continue to f/up with sleep medicine doctor yearly. Will monitor alongside specialist.    FOLLOW UP:   Follow up in 2 weeks. He was informed of the importance of frequent follow up visits to maximize his success with intensive lifestyle modifications for his multiple health conditions.  Ian Morse is aware that we will review all of his lab results at our next visit.  He is aware that if anything is critical/ life threatening with the results, we will be contacting him via MyChart prior to the office visit to discuss management.    Chief Complaint:   Ian Morse (MR# [id number]) is a pleasant 53 y.o. male who presents for evaluation and treatment of Ian and related comorbidities. Current BMI is Body mass index is 43.33 kg/m. Ian Morse has been struggling with his weight for many years and has been unsuccessful in either losing weight, maintaining weight loss, or reaching his healthy weight goal.  Ian Morse is currently in the action stage of change and ready to dedicate time achieving and maintaining a healthier weight. Ian Morse is interested in becoming our patient and working on intensive lifestyle modifications including (but not limited to) diet and exercise for weight loss.  Ian Morse is on disability so he is not working. Patient is married to [place]  and has adult children. He lives with his wife and 52 y.o granddaughter, Ian Morse.  Does not smoke but chews tobacco two cans daily for 35+ years  No regular exercise beyond yard work  Desires to lose 90+ lbs in one year  Never tried any diets in the past  Eats out 1x/week  Obstacle to cooking is time, but enjoys cooking  Craves chocolate  Snacks on chocolate and potato chips  Skips breakfast 3-4x/week  Drinks coffee  Worst food habit: Eating too fast  Subjective:   This is the patient's first visit at [facility].  The patient's NEW PATIENT PACKET that they filled out prior to today's office visit was reviewed at length and information from that paperwork was included within the following office visit note.    Included in the packet: current and past health history, medications, allergies, ROS, gynecologic history (women only), surgical history, family history, social history, weight history, weight loss surgery history (for those that have had weight loss surgery), nutritional evaluation, mood and food questionnaire along with a depression screening (PHQ9) on all patients, an Epworth questionnaire, sleep habits questionnaire, patient life and health improvement goals questionnaire. These will all be scanned into the patient's chart under the "media" tab.   Review of Systems: Please refer to new patient packet scanned into  media. Pertinent positives were addressed with patient today.  Reviewed by clinician on day of visit: allergies, medications, problem list, medical history, surgical history, family history, social history, and previous encounter notes.  During the visit, I independently reviewed the patient's EKG, bioimpedance scale results, and indirect calorimeter results. I used this information to tailor a meal plan for the patient that will help Ian Morse to lose weight and will improve his Ian-related conditions going forward.  I performed a medically necessary appropriate examination and/or evaluation. I discussed the assessment and treatment plan with the patient. The patient was provided an opportunity to ask questions and all were answered. The patient agreed with the plan and demonstrated an understanding of the instructions. Labs were ordered today (unless patient declined them) and will be  reviewed with the patient at our next visit unless more critical results need to be addressed immediately. Clinical information was updated and documented in the EMR.    Objective:   PHYSICAL EXAM: Blood pressure 116/75, pulse 76, temperature 99.2 F (37.3 C), height 5\' 8"  (1.727 m), weight 285 lb (129.3 kg), SpO2 97%. Body mass index is 43.33 kg/m.  General: Well Developed, well nourished, and in no acute distress.  HEENT: Normocephalic, atraumatic; EOMI, sclerae are anicteric. Skin: Warm and dry, good turgor Chest:  Normal excursion, shape, no gross ABN Respiratory: No conversational dyspnea; speaking in full sentences NeuroM-Sk:  Normal gross ROM * 4 extremities  Psych: A and O *3, insight adequate, mood- full   Anthropometric Measurements Height: 5\' 8"  (1.727 m) Weight: 285 lb (129.3 kg) BMI (Calculated): 43.34 Weight at Last Visit: N/a Weight Lost Since Last Visit: N/a Weight Gained Since Last Visit: N/a Starting Weight: 285lb Total Weight Loss (lbs): 0 lb (0 kg) Peak Weight: 351lb Waist  Measurement : 60 inches   Body Composition  Body Fat %: 39.7 % Fat Mass (lbs): 113.2 lbs Muscle Mass (lbs): 163.6 lbs Total Body Water (lbs): 119.6 lbs Visceral Fat Rating : 26   Other Clinical Data RMR: 2261 (2261) Fasting: Yes Labs: Yes Today's Visit #: 1 Starting Date: 02/06/24    DIAGNOSTIC DATA REVIEWED:  BMET    Component Value Date/Time   NA 144 04/05/2017 1348   K 4.1 04/05/2017 1348   K 4.1 08/21/2014 1409   CL 102 04/05/2017 1348   CO2 25 04/05/2017 1348   GLUCOSE 71 04/05/2017 1348   BUN 15 04/05/2017 1348   CREATININE 0.68 (L) 04/05/2017 1348   CALCIUM 9.5 04/05/2017 1348   GFRNONAA 114 04/05/2017 1348   GFRAA 131 04/05/2017 1348   No results found for: "HGBA1C" No results found for: "INSULIN " No results found for: "TSH" CBC    Component Value Date/Time   WBC 7.2 08/21/2014 1409   RBC 4.42 08/21/2014 1409   HGB 12.4 (L) 08/21/2014 1409   HCT 37.8 (L) 08/21/2014 1409   PLT 316 08/21/2014 1409   MCV 86 08/21/2014 1409   MCH 28.1 08/21/2014 1409   MCHC 32.9 08/21/2014 1409   RDW 14.9 (H) 08/21/2014 1409   Iron Studies No results found for: "IRON", "TIBC", "FERRITIN", "IRONPCTSAT" Lipid Panel  No results found for: "CHOL", "TRIG", "HDL", "CHOLHDL", "VLDL", "LDLCALC", "LDLDIRECT" Hepatic Function Panel     Component Value Date/Time   PROT 6.7 04/05/2017 1348   ALBUMIN 4.5 04/05/2017 1348   AST 16 04/05/2017 1348   ALT 24 04/05/2017 1348   ALKPHOS 82 04/05/2017 1348   BILITOT 0.3 04/05/2017 1348   No results found for: "TSH" Nutritional No results found for: "VD25OH"  Attestation Statements:   I, Camryn Mix, acting as a Stage manager for [employer], DO., have compiled all relevant documentation for today's office visit on behalf of Marceil Sensor, DO, while in the presence of [employer], DO.  I have reviewed the above documentation for accuracy and completeness, and I agree with the above. Ian Morse, D.O.  The 21st  Century Cures Act was signed into law in 2016 which includes the topic of electronic health records.  This provides immediate access to information in MyChart.  This includes consultation notes, operative notes, office notes, lab results and pathology reports.  If you have any questions about what you read please let us  know at your next visit so we can discuss your  concerns and take corrective action if need be.  We are right here with you.

## 2024-02-08 LAB — HEMOGLOBIN A1C
Est. average glucose Bld gHb Est-mCnc: 100 mg/dL
Hgb A1c MFr Bld: 5.1 % (ref 4.8–5.6)

## 2024-02-08 LAB — VITAMIN D 25 HYDROXY (VIT D DEFICIENCY, FRACTURES): Vit D, 25-Hydroxy: 34.5 ng/mL (ref 30.0–100.0)

## 2024-02-08 LAB — INSULIN, RANDOM: INSULIN: 14.4 u[IU]/mL (ref 2.6–24.9)

## 2024-02-08 LAB — VITAMIN B12: Vitamin B-12: 257 pg/mL (ref 232–1245)

## 2024-02-08 LAB — FOLATE: Folate: 8.5 ng/mL (ref >3.0)

## 2024-02-20 ENCOUNTER — Ambulatory Visit (INDEPENDENT_AMBULATORY_CARE_PROVIDER_SITE_OTHER): Admitting: Family Medicine

## 2024-04-09 ENCOUNTER — Ambulatory Visit (INDEPENDENT_AMBULATORY_CARE_PROVIDER_SITE_OTHER): Admitting: Family Medicine
